# Patient Record
Sex: Male | Born: 1970 | Race: Black or African American | Hispanic: No | Marital: Married | Smoking: Never smoker
Health system: Southern US, Community
[De-identification: ages and names within clinical notes are randomized; demographics above are authoritative.]

## PROBLEM LIST (undated history)

## (undated) DIAGNOSIS — I10 Essential (primary) hypertension: Secondary | ICD-10-CM

## (undated) HISTORY — PX: BACK SURGERY: SHX140

---

## 1999-12-21 ENCOUNTER — Emergency Department (HOSPITAL_COMMUNITY): Admission: EM | Admit: 1999-12-21 | Discharge: 1999-12-21 | Payer: Self-pay | Admitting: Emergency Medicine

## 2000-01-01 ENCOUNTER — Encounter: Payer: Self-pay | Admitting: Occupational Medicine

## 2000-01-01 ENCOUNTER — Ambulatory Visit (HOSPITAL_COMMUNITY): Admission: RE | Admit: 2000-01-01 | Discharge: 2000-01-01 | Payer: Self-pay | Admitting: Occupational Medicine

## 2000-01-16 ENCOUNTER — Encounter: Payer: Self-pay | Admitting: Neurosurgery

## 2000-01-16 ENCOUNTER — Ambulatory Visit (HOSPITAL_COMMUNITY): Admission: RE | Admit: 2000-01-16 | Discharge: 2000-01-16 | Payer: Self-pay | Admitting: Neurosurgery

## 2000-02-17 ENCOUNTER — Encounter: Payer: Self-pay | Admitting: Neurosurgery

## 2000-02-17 ENCOUNTER — Inpatient Hospital Stay (HOSPITAL_COMMUNITY): Admission: RE | Admit: 2000-02-17 | Discharge: 2000-02-19 | Payer: Self-pay | Admitting: Neurosurgery

## 2000-02-21 ENCOUNTER — Encounter: Payer: Self-pay | Admitting: Neurosurgery

## 2000-02-21 ENCOUNTER — Ambulatory Visit (HOSPITAL_COMMUNITY): Admission: RE | Admit: 2000-02-21 | Discharge: 2000-02-21 | Payer: Self-pay | Admitting: Neurosurgery

## 2000-05-21 ENCOUNTER — Ambulatory Visit (HOSPITAL_COMMUNITY): Admission: RE | Admit: 2000-05-21 | Discharge: 2000-05-21 | Payer: Self-pay | Admitting: Neurosurgery

## 2000-05-21 ENCOUNTER — Encounter: Payer: Self-pay | Admitting: Neurosurgery

## 2001-04-02 ENCOUNTER — Emergency Department (HOSPITAL_COMMUNITY): Admission: EM | Admit: 2001-04-02 | Discharge: 2001-04-02 | Payer: Self-pay | Admitting: Emergency Medicine

## 2001-08-31 ENCOUNTER — Encounter: Payer: Self-pay | Admitting: Emergency Medicine

## 2001-08-31 ENCOUNTER — Emergency Department (HOSPITAL_COMMUNITY): Admission: EM | Admit: 2001-08-31 | Discharge: 2001-08-31 | Payer: Self-pay | Admitting: Emergency Medicine

## 2005-07-11 ENCOUNTER — Emergency Department (HOSPITAL_COMMUNITY): Admission: EM | Admit: 2005-07-11 | Discharge: 2005-07-11 | Payer: Self-pay | Admitting: Emergency Medicine

## 2008-01-03 ENCOUNTER — Emergency Department (HOSPITAL_COMMUNITY): Admission: EM | Admit: 2008-01-03 | Discharge: 2008-01-03 | Payer: Self-pay | Admitting: Emergency Medicine

## 2008-01-12 ENCOUNTER — Emergency Department (HOSPITAL_COMMUNITY): Admission: EM | Admit: 2008-01-12 | Discharge: 2008-01-12 | Payer: Self-pay | Admitting: Emergency Medicine

## 2011-01-24 NOTE — Op Note (Signed)
[place]. [facility]  Patient:    Troy Calderon, Troy Calderon                       MRN: [id number] Proc. Date: 02/17/00 Adm. Date:  [id number] Disc. 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  PROCEDURE:  Bilateral L5-S1 decompressive laminectomy followed by L5-S1 microdiskectomy for herniated disk followed by tangent interbody fusion.  SURGEON:  Reinaldo Meeker, M.D.  ASSISTANT:  Julio Sicks, M.D.  DESCRIPTION OF PROCEDURE:  After being placed in the prone position, the patients back was prepped and draped in the usual sterile fashion.  A midline incision was made above the lower lumbar spine and upper sacrum.  Using the Bovie cutting current, the incision was carried down to the spinous processes.  Subperiosteal  dissection was then carried out bilaterally in the spinous processes of the lamina as well as exposure of the facet joint.  Self-retaining retractor was placed for exposure.  Fluoroscopy showed approach of the L5-S1 level.  The spinous processes and intraspinous ligament was removed.  Highspeed drill was used to perform generous laminotomies by removing basically the entire L5 lamina which was free  floating due to the porous defects.  The residual bone was removed and saved for use later during the case.  Lateral dissection was carried out in order to identify both the L5 and S1 nerve roots bilaterally.  At this point, the microscope was draped and brought into the field.  Starting on the patients left, symptomatic side, microdissection technique was used to identify the lateral aspect of the thecal sac and S1 nerve root.  Further coagulation was carried down to the floor of the canal to identify the L5-S1 disk.  After  coagulating on the annulus, the annulus was incised with a 15 blade.  Using pituitary rongeurs and curets, thorough disk space cleanout was carried out.  Inspection of the foramen up towards the 5 nerve root yielded a large fragment of subligamentous disk and this was removed. This gave excellent decompression of the neuroforamen.  At this point, inspection was carried out for residual compression and none could be identified.  A similar diskectomy was then carried out on the patients right, asymptomatic side, once again being careful to fully decompress the L5 and S1 nerve roots.  At this point, large amounts of irrigation were carried out.  Using alternating distractors from one side to the other to distract the disk space, it was elected to place 12 x 0 mm tangent plugs.  Starting on the patients left side, tange instrumentation was used scraping, chiseling, and bone placement under fluoroscopic guidance.  The component was in good position.  The distractor was then removed from the patients right side and similar placement was carried out over there.  Fluroscopy at this time showed the bone plugs to be in good position.  Large amounts of irrigation  were carried out.  At this point, Julio Sicks, M.D. assumed control of the case or placement of pedicle screws.  Once those were completed, a medium hemovac drain was placed in the epidural space and the wound was then closed using  interrupted Vicryl on the muscle, fascia, subcutaneous and subcuticular tissues, and staples on the skin.  A sterile dressing was then applied.  The patient was extubated and taken to the recovery room in stable condition. DD:  02/17/00 TD:  02/19/00 Job: [id number]

## 2011-01-24 NOTE — Op Note (Signed)
[place]. [facility]  Patient:    Troy Calderon, Troy Calderon                       MRN: [id number] Proc. Date: 02/17/00 Adm. Date:  [id number] Disc. 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  OPERATION PERFORMED:  Posterolateral fusion with local autograft at L5-S1 augmented with pedicle screw instrumentation.  SURGEON:  Julio Sicks, M.D.  ASSISTANT:  ANESTHESIA:  General endotracheal.  INDICATIONS FOR PROCEDURE:  Mr. Troy Calderon is a 40 year old black male patient of Dr. Gerlene Fee.  The patient has history of severe back and left lower extremity pain consistent with a left-sided L5 radiculopathy which has failed efforts at conservative management.  MRI scanning and plain films demonstrate L5 spondylysis and early grade 1 L5-S1 spondylolisthesis as well as a leftward L5-S1 foraminal disk herniation.  The patient presents now for decompression by means of an L5 Gill procedure and left-sided L5-S1 microdiskectomy by Dr. Gerlene Fee.  He will also perform interbody fusion using tangent wedges and local autograft.  I have been asked to provide further support by means of a posterolateral fusion with pedicle screw instrumentation.  DESCRIPTION OF PROCEDURE:  After completion of the interbody fusion by Dr. Gerlene Fee, I exposed the transverse processes of L5 and the ala of the sacrum bilaterally.  The pedicles were then isolated, using surface landmarks and intraoperative fluoroscopy.  Cortical bone overlying the pedicles was removed using a high speed drill at all four locations.  A pedicle awl was then used to pass into the pedicled L5 starting first on the left side, then subsequently on the right side.  The pedicle awl was then passed into  the pedicle of S1 bilaterally as well.  All tracks were found to be solidly within bone by using blunt probes.  All tracks were then tapped using a 5.25 mm screw tap.  A screw tap holes were found to be solid within bone and well directed by fluoroscopy.  At L5 6.75 x 45 mm SDRS variable head and pedicle screws were placed bilaterally.  At S1 6.75 x 40 mm SDRS variable headed screws were placed bilaterally.  All screws were found to be well positioned by intraoperative fluoroscopy.  A short segment titanium rod was then placed between the screw heads.  It was contoured appropriately.  The screw heads were then engaged and locking caps were placed.  The construct was placed under compression.  The pedicle screws were found to be in good position on both lateral and AP fluoroscopy.  Blunt probes passed easily out the L5 and S1 foramina bilaterally.  There was no evidence of pedicle violation.  The transverse processes were then decorticated at L5 bilaterally as was the ala of the sacrum bilaterally.  Morselized autograft was packed posterolaterally for fusion.  The wound was then copiously irrigated with antibiotic solution. The ____________ inspected one final time.  There was no evidence of injury to the nerve roots or thecal sac.  Dr. Gerlene Fee then closed the wound in typical fashion leaving a medium hemovac drain in the epidural space. DD:  02/17/00 TD:  02/19/00 Job: [id number]

## 2013-09-15 ENCOUNTER — Encounter (HOSPITAL_COMMUNITY): Payer: Self-pay | Admitting: Emergency Medicine

## 2013-09-15 ENCOUNTER — Emergency Department (INDEPENDENT_AMBULATORY_CARE_PROVIDER_SITE_OTHER)
Admission: EM | Admit: 2013-09-15 | Discharge: 2013-09-15 | Disposition: A | Discharge status: Home / Self Care | Payer: Worker's Compensation | Source: Home / Self Care | Attending: Family Medicine | Admitting: Family Medicine

## 2013-09-15 DIAGNOSIS — M542 Cervicalgia: Secondary | ICD-10-CM | POA: Diagnosis not present

## 2013-09-15 DIAGNOSIS — M549 Dorsalgia, unspecified: Secondary | ICD-10-CM | POA: Diagnosis not present

## 2013-09-15 DIAGNOSIS — R51 Headache: Secondary | ICD-10-CM

## 2013-09-15 MED ORDER — CYCLOBENZAPRINE HCL 5 MG PO TABS: 5.0000 mg | ORAL_TABLET | Freq: Three times a day (TID) | ORAL | Status: DC | PRN

## 2013-09-15 MED ORDER — IBUPROFEN 800 MG PO TABS: 800.0000 mg | ORAL_TABLET | Freq: Three times a day (TID) | ORAL | Status: DC
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
## 2014-01-10 ENCOUNTER — Ambulatory Visit
Admission: RE | Admit: 2014-01-10 | Discharge: 2014-01-10 | Disposition: A | Discharge status: Home / Self Care | Payer: Worker's Compensation | Source: Ambulatory Visit | Attending: Family | Admitting: Family

## 2014-01-10 ENCOUNTER — Other Ambulatory Visit: Payer: Self-pay | Admitting: Family

## 2014-01-10 DIAGNOSIS — M2560 Stiffness of unspecified joint, not elsewhere classified: Principal | ICD-10-CM

## 2014-01-10 DIAGNOSIS — M2569 Stiffness of other specified joint, not elsewhere classified: Secondary | ICD-10-CM

## 2016-01-02 ENCOUNTER — Ambulatory Visit
Admission: RE | Admit: 2016-01-02 | Discharge: 2016-01-02 | Disposition: A | Discharge status: Home / Self Care | Payer: Worker's Compensation | Source: Ambulatory Visit | Attending: Nurse Practitioner | Admitting: Nurse Practitioner

## 2016-01-02 ENCOUNTER — Other Ambulatory Visit: Payer: Self-pay | Admitting: Nurse Practitioner

## 2016-01-02 DIAGNOSIS — M549 Dorsalgia, unspecified: Secondary | ICD-10-CM

## 2016-01-02 DIAGNOSIS — S39012A Strain of muscle, fascia and tendon of lower back, initial encounter: Secondary | ICD-10-CM

## 2016-08-03 ENCOUNTER — Emergency Department (HOSPITAL_COMMUNITY)
Admission: EM | Admit: 2016-08-03 | Discharge: 2016-08-03 | Disposition: A | Discharge status: Home / Self Care | Payer: No Typology Code available for payment source | Attending: Emergency Medicine | Admitting: Emergency Medicine

## 2016-08-03 ENCOUNTER — Encounter (HOSPITAL_COMMUNITY): Payer: Self-pay | Admitting: Emergency Medicine

## 2016-08-03 ENCOUNTER — Emergency Department (HOSPITAL_COMMUNITY): Payer: No Typology Code available for payment source

## 2016-08-03 DIAGNOSIS — Y92410 Unspecified street and highway as the place of occurrence of the external cause: Secondary | ICD-10-CM | POA: Insufficient documentation

## 2016-08-03 DIAGNOSIS — Y999 Unspecified external cause status: Secondary | ICD-10-CM | POA: Diagnosis not present

## 2016-08-03 DIAGNOSIS — I10 Essential (primary) hypertension: Secondary | ICD-10-CM | POA: Diagnosis not present

## 2016-08-03 DIAGNOSIS — S199XXA Unspecified injury of neck, initial encounter: Secondary | ICD-10-CM | POA: Diagnosis present

## 2016-08-03 DIAGNOSIS — S161XXA Strain of muscle, fascia and tendon at neck level, initial encounter: Secondary | ICD-10-CM | POA: Insufficient documentation

## 2016-08-03 DIAGNOSIS — Y939 Activity, unspecified: Secondary | ICD-10-CM | POA: Insufficient documentation

## 2016-08-03 HISTORY — DX: Essential (primary) hypertension: I10

## 2016-08-03 MED ORDER — CYCLOBENZAPRINE HCL 10 MG PO TABS
10.0000 mg | ORAL_TABLET | Freq: Once | ORAL | Status: AC
  Administered 2016-08-03: 10 mg via ORAL
  Filled 2016-08-03: qty 1

## 2016-08-03 MED ORDER — TRAMADOL HCL 50 MG PO TABS: 50.0000 mg | ORAL_TABLET | Freq: Four times a day (QID) | ORAL | 0 refills | Status: DC | PRN

## 2016-08-03 MED ORDER — METHOCARBAMOL 500 MG PO TABS: 500.0000 mg | ORAL_TABLET | Freq: Two times a day (BID) | ORAL | 0 refills | Status: DC

## 2016-08-03 MED ORDER — IBUPROFEN 400 MG PO TABS
800.0000 mg | ORAL_TABLET | Freq: Once | ORAL | Status: AC
  Administered 2016-08-03: 800 mg via ORAL
  Filled 2016-08-03: qty 2

## 2016-08-03 NOTE — Discharge Instructions (Signed)
The medication for pain and the muscle relaxant can make you sleepy. Do not drive while taking them.
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
## 2016-09-19 DIAGNOSIS — D500 Iron deficiency anemia secondary to blood loss (chronic): Secondary | ICD-10-CM | POA: Diagnosis not present

## 2016-10-02 DIAGNOSIS — I214 Non-ST elevation (NSTEMI) myocardial infarction: Secondary | ICD-10-CM | POA: Diagnosis not present

## 2016-10-10 DIAGNOSIS — I214 Non-ST elevation (NSTEMI) myocardial infarction: Secondary | ICD-10-CM | POA: Diagnosis not present

## 2016-11-05 DIAGNOSIS — I10 Essential (primary) hypertension: Secondary | ICD-10-CM | POA: Diagnosis not present

## 2016-11-07 DIAGNOSIS — I214 Non-ST elevation (NSTEMI) myocardial infarction: Secondary | ICD-10-CM | POA: Diagnosis not present

## 2016-12-15 DIAGNOSIS — I214 Non-ST elevation (NSTEMI) myocardial infarction: Secondary | ICD-10-CM | POA: Diagnosis not present

## 2017-01-09 DIAGNOSIS — I214 Non-ST elevation (NSTEMI) myocardial infarction: Secondary | ICD-10-CM | POA: Diagnosis not present

## 2017-01-27 DIAGNOSIS — Z0000 Encounter for general adult medical examination without abnormal findings: Secondary | ICD-10-CM | POA: Diagnosis not present

## 2017-01-27 DIAGNOSIS — I10 Essential (primary) hypertension: Secondary | ICD-10-CM | POA: Diagnosis not present

## 2017-01-27 DIAGNOSIS — Z125 Encounter for screening for malignant neoplasm of prostate: Secondary | ICD-10-CM | POA: Diagnosis not present

## 2017-01-27 DIAGNOSIS — Z13220 Encounter for screening for lipoid disorders: Secondary | ICD-10-CM | POA: Diagnosis not present

## 2017-03-17 DIAGNOSIS — R109 Unspecified abdominal pain: Secondary | ICD-10-CM | POA: Diagnosis not present

## 2017-03-24 ENCOUNTER — Other Ambulatory Visit: Payer: Self-pay | Admitting: Family Medicine

## 2017-03-24 DIAGNOSIS — K859 Acute pancreatitis without necrosis or infection, unspecified: Secondary | ICD-10-CM

## 2017-03-27 ENCOUNTER — Other Ambulatory Visit: Payer: Self-pay | Admitting: Family Medicine

## 2017-03-27 DIAGNOSIS — K859 Acute pancreatitis without necrosis or infection, unspecified: Secondary | ICD-10-CM

## 2017-04-01 ENCOUNTER — Ambulatory Visit
Admission: RE | Admit: 2017-04-01 | Discharge: 2017-04-01 | Disposition: A | Discharge status: Home / Self Care | Payer: 59 | Source: Ambulatory Visit | Attending: Family Medicine | Admitting: Family Medicine

## 2017-04-01 DIAGNOSIS — K859 Acute pancreatitis without necrosis or infection, unspecified: Secondary | ICD-10-CM | POA: Diagnosis not present

## 2017-04-01 MED ORDER — IOPAMIDOL (ISOVUE-300) INJECTION 61%: 100.0000 mL | Freq: Once | INTRAVENOUS | Status: DC | PRN

## 2017-08-03 DIAGNOSIS — R7303 Prediabetes: Secondary | ICD-10-CM | POA: Diagnosis not present

## 2017-08-03 DIAGNOSIS — R109 Unspecified abdominal pain: Secondary | ICD-10-CM | POA: Diagnosis not present

## 2018-01-27 DIAGNOSIS — I10 Essential (primary) hypertension: Secondary | ICD-10-CM | POA: Diagnosis not present

## 2018-01-27 DIAGNOSIS — Z6837 Body mass index (BMI) 37.0-37.9, adult: Secondary | ICD-10-CM | POA: Diagnosis not present

## 2018-01-27 DIAGNOSIS — Z0000 Encounter for general adult medical examination without abnormal findings: Secondary | ICD-10-CM | POA: Diagnosis not present

## 2018-03-24 DIAGNOSIS — I10 Essential (primary) hypertension: Secondary | ICD-10-CM | POA: Diagnosis not present

## 2018-06-24 DIAGNOSIS — J069 Acute upper respiratory infection, unspecified: Secondary | ICD-10-CM | POA: Diagnosis not present

## 2018-09-10 DIAGNOSIS — J988 Other specified respiratory disorders: Secondary | ICD-10-CM | POA: Diagnosis not present

## 2018-10-11 ENCOUNTER — Ambulatory Visit
Admission: RE | Admit: 2018-10-11 | Discharge: 2018-10-11 | Disposition: A | Discharge status: Home / Self Care | Payer: 59 | Source: Ambulatory Visit | Attending: Family Medicine | Admitting: Family Medicine

## 2018-10-11 ENCOUNTER — Other Ambulatory Visit: Payer: Self-pay | Admitting: Family Medicine

## 2018-10-11 DIAGNOSIS — R059 Cough, unspecified: Secondary | ICD-10-CM

## 2018-10-11 DIAGNOSIS — R05 Cough: Secondary | ICD-10-CM

## 2018-10-11 DIAGNOSIS — R509 Fever, unspecified: Secondary | ICD-10-CM | POA: Diagnosis not present

## 2018-12-24 DIAGNOSIS — I10 Essential (primary) hypertension: Secondary | ICD-10-CM | POA: Diagnosis not present

## 2019-05-20 ENCOUNTER — Encounter (HOSPITAL_COMMUNITY): Payer: Self-pay | Admitting: *Deleted

## 2019-05-20 ENCOUNTER — Emergency Department (HOSPITAL_COMMUNITY): Payer: 59

## 2019-05-20 ENCOUNTER — Other Ambulatory Visit: Payer: Self-pay

## 2019-05-20 ENCOUNTER — Inpatient Hospital Stay (HOSPITAL_COMMUNITY)
Admission: EM | Admit: 2019-05-20 | Discharge: 2019-05-25 | DRG: 177 | Disposition: A | Discharge status: Home / Self Care | Payer: 59 | Attending: Family Medicine | Admitting: Family Medicine

## 2019-05-20 DIAGNOSIS — E860 Dehydration: Secondary | ICD-10-CM | POA: Diagnosis present

## 2019-05-20 DIAGNOSIS — J1282 Pneumonia due to coronavirus disease 2019: Secondary | ICD-10-CM | POA: Diagnosis present

## 2019-05-20 DIAGNOSIS — E876 Hypokalemia: Secondary | ICD-10-CM | POA: Diagnosis not present

## 2019-05-20 DIAGNOSIS — U071 COVID-19: Principal | ICD-10-CM | POA: Diagnosis present

## 2019-05-20 DIAGNOSIS — R739 Hyperglycemia, unspecified: Secondary | ICD-10-CM | POA: Diagnosis present

## 2019-05-20 DIAGNOSIS — Z79891 Long term (current) use of opiate analgesic: Secondary | ICD-10-CM

## 2019-05-20 DIAGNOSIS — Z791 Long term (current) use of non-steroidal anti-inflammatories (NSAID): Secondary | ICD-10-CM

## 2019-05-20 DIAGNOSIS — I119 Hypertensive heart disease without heart failure: Secondary | ICD-10-CM | POA: Diagnosis present

## 2019-05-20 DIAGNOSIS — E871 Hypo-osmolality and hyponatremia: Secondary | ICD-10-CM | POA: Diagnosis present

## 2019-05-20 DIAGNOSIS — N179 Acute kidney failure, unspecified: Secondary | ICD-10-CM

## 2019-05-20 DIAGNOSIS — I959 Hypotension, unspecified: Secondary | ICD-10-CM | POA: Diagnosis present

## 2019-05-20 DIAGNOSIS — R55 Syncope and collapse: Secondary | ICD-10-CM

## 2019-05-20 DIAGNOSIS — J1289 Other viral pneumonia: Secondary | ICD-10-CM | POA: Diagnosis present

## 2019-05-20 DIAGNOSIS — Z79899 Other long term (current) drug therapy: Secondary | ICD-10-CM

## 2019-05-20 DIAGNOSIS — E861 Hypovolemia: Secondary | ICD-10-CM | POA: Diagnosis present

## 2019-05-20 LAB — CBC
HCT: 42.4 % (ref 39.0–52.0)
Hemoglobin: 14.3 g/dL (ref 13.0–17.0)
MCH: 29.3 pg (ref 26.0–34.0)
MCHC: 33.7 g/dL (ref 30.0–36.0)
MCV: 86.9 fL (ref 80.0–100.0)
Platelets: 167 10*3/uL (ref 150–400)
RBC: 4.88 MIL/uL (ref 4.22–5.81)
RDW: 13.4 % (ref 11.5–15.5)
WBC: 5.4 10*3/uL (ref 4.0–10.5)
nRBC: 0 % (ref 0.0–0.2)

## 2019-05-20 LAB — BASIC METABOLIC PANEL
Anion gap: 12 (ref 5–15)
BUN: 13 mg/dL (ref 6–20)
CO2: 24 mmol/L (ref 22–32)
Calcium: 8.7 mg/dL — ABNORMAL LOW (ref 8.9–10.3)
Chloride: 96 mmol/L — ABNORMAL LOW (ref 98–111)
Creatinine, Ser: 1.96 mg/dL — ABNORMAL HIGH (ref 0.61–1.24)
GFR calc Af Amer: 46 mL/min — ABNORMAL LOW (ref >60)
GFR calc non Af Amer: 39 mL/min — ABNORMAL LOW (ref >60)
Glucose, Bld: 130 mg/dL — ABNORMAL HIGH (ref 70–99)
Potassium: 3 mmol/L — ABNORMAL LOW (ref 3.5–5.1)
Sodium: 132 mmol/L — ABNORMAL LOW (ref 135–145)

## 2019-05-20 LAB — TROPONIN I (HIGH SENSITIVITY): Troponin I (High Sensitivity): 6 ng/L (ref <18)

## 2019-05-20 MED ORDER — LACTATED RINGERS IV BOLUS
500.0000 mL | Freq: Once | INTRAVENOUS | Status: AC
  Administered 2019-05-21: 500 mL via INTRAVENOUS

## 2019-05-20 MED ORDER — ACETAMINOPHEN 500 MG PO TABS
1000.0000 mg | ORAL_TABLET | Freq: Once | ORAL | Status: AC
  Administered 2019-05-20: 1000 mg via ORAL
  Filled 2019-05-20: qty 2

## 2019-05-20 MED ORDER — SODIUM CHLORIDE 0.9% FLUSH
3.0000 mL | Freq: Once | INTRAVENOUS | Status: AC
  Administered 2019-05-21: 3 mL via INTRAVENOUS

## 2019-05-20 MED ORDER — POTASSIUM CHLORIDE CRYS ER 20 MEQ PO TBCR
40.0000 meq | EXTENDED_RELEASE_TABLET | Freq: Once | ORAL | Status: AC
  Administered 2019-05-21: 40 meq via ORAL
  Filled 2019-05-20: qty 2

## 2019-05-20 MED ORDER — IOHEXOL 350 MG/ML SOLN
100.0000 mL | Freq: Once | INTRAVENOUS | Status: AC | PRN
  Administered 2019-05-20: 100 mL via INTRAVENOUS

## 2019-05-20 MED ORDER — ASPIRIN 81 MG PO CHEW
324.0000 mg | CHEWABLE_TABLET | Freq: Once | ORAL | Status: AC
  Administered 2019-05-20: 324 mg via ORAL
  Filled 2019-05-20: qty 4

## 2019-05-20 NOTE — ED Provider Notes (Signed)
[facility] Provider Note   CSN: [id number] Arrival date & time: 05/20/19  1956     History   Chief Complaint Chief Complaint  Patient presents with  . Chest Pain    HPI Troy Calderon is a [age] y.o. male with a past medical history of hypertension who recently tested positive for COVID who presented to the emergency department with shortness of breath.  Patient had a syncopal episode while in triage.  Patient reports prior to having a syncopal episode he felt increasingly short of breath.  Patient reports he tested positive for COVID-19 5 days ago and that his family also tested positive.  Patient reports over the last 1 to 2 days he has felt worsening shortness of breath and having intermittent left chest pressure which he has not noticed change with exertion.  Patient denies nausea or diaphoresis.  Patient reports he has had difficulty sleeping the last 2 nights due to shortness of breath.    The history is provided by the patient.    Past Medical History:  Diagnosis Date  . Hypertension     Patient Active Problem List   Diagnosis Date Noted  . Acute kidney injury (HCC) 05/21/2019    Past Surgical History:  Procedure Laterality Date  . BACK SURGERY          Home Medications    Prior to Admission medications   Medication Sig Start Date End Date Taking? Authorizing Provider  cyclobenzaprine (FLEXERIL) 5 MG tablet Take 1 tablet (5 mg total) by mouth 3 (three) times daily as needed for muscle spasms. 09/15/13   Linna HoffKindl, James D, MD  ibuprofen (ADVIL,MOTRIN) 800 MG tablet Take 1 tablet (800 mg total) by mouth 3 (three) times daily. For pain 09/15/13   Linna HoffKindl, James D, MD  methocarbamol (ROBAXIN) 500 MG tablet Take 1 tablet (500 mg total) by mouth 2 (two) times daily. 08/03/16   Janne NapoleonNeese, Hope M, NP  traMADol (ULTRAM) 50 MG tablet Take 1 tablet (50 mg total) by mouth every 6 (six) hours as needed. 08/03/16   Janne NapoleonNeese, Hope M, NP    Family History  No family history on file.  Social History Social History   Tobacco Use  . Smoking status: Never Smoker  . Smokeless tobacco: Never Used  Substance Use Topics  . Alcohol use: Yes  . Drug use: No     Allergies   Patient has no known allergies.   Review of Systems Review of Systems  Constitutional: Positive for fatigue and fever.  HENT: Negative for trouble swallowing.   Respiratory: Positive for cough, chest tightness and shortness of breath.   Cardiovascular: Negative for leg swelling.  Gastrointestinal: Negative for abdominal pain, constipation and vomiting.  Genitourinary: Negative for difficulty urinating and dysuria.  Musculoskeletal: Negative for back pain.  Skin: Negative for rash.  Neurological: Positive for syncope. Negative for numbness and headaches.  Psychiatric/Behavioral: Negative for confusion.     Physical Exam Updated Vital Signs BP 122/85   Pulse 98   Temp 98.6 F (37 C) (Oral)   Resp (!) 22   Ht 6\' 1"  (1.854 m)   Wt 121.6 kg   SpO2 96%   BMI 35.36 kg/m   Physical Exam Constitutional:      General: He is not in acute distress. HENT:     Head: Normocephalic and atraumatic.     Right Ear: External ear normal.     Left Ear: External ear normal.  Nose: Nose normal.     Mouth/Throat:     Mouth: Mucous membranes are moist.     Pharynx: Oropharynx is clear.  Eyes:     Conjunctiva/sclera: Conjunctivae normal.  Neck:     Musculoskeletal: Neck supple.  Cardiovascular:     Rate and Rhythm: Regular rhythm. Tachycardia present.     Pulses: Normal pulses.  Pulmonary:     Effort: Pulmonary effort is normal. No respiratory distress.     Breath sounds: No stridor. No wheezing.     Comments: Tachypneic Chest:     Chest wall: No tenderness.  Abdominal:     Palpations: Abdomen is soft.     Tenderness: There is no abdominal tenderness. There is no guarding or rebound.  Musculoskeletal:        General: No tenderness.     Right lower leg: No  edema.     Left lower leg: No edema.  Skin:    General: Skin is warm and dry.  Neurological:     General: No focal deficit present.     Mental Status: He is alert and oriented to person, place, and time.     Cranial Nerves: No cranial nerve deficit.     Sensory: No sensory deficit.     Motor: No weakness.      ED Treatments / Results  Labs (all labs ordered are listed, but only abnormal results are displayed) Labs Reviewed  BASIC METABOLIC PANEL - Abnormal; Notable for the following components:      Result Value   Sodium 132 (*)    Potassium 3.0 (*)    Chloride 96 (*)    Glucose, Bld 130 (*)    Creatinine, Ser 1.96 (*)    Calcium 8.7 (*)    GFR calc non Af Amer 39 (*)    GFR calc Af Amer 46 (*)    All other components within normal limits  FIBRINOGEN - Abnormal; Notable for the following components:   Fibrinogen 516 (*)    All other components within normal limits  SARS CORONAVIRUS 2 (HOSPITAL ORDER, PERFORMED IN [facility])  CULTURE, BLOOD (ROUTINE X 2)  CULTURE, BLOOD (ROUTINE X 2)  CBC  D-DIMER, QUANTITATIVE (NOT AT [facility])  MAGNESIUM  LACTIC ACID, PLASMA  LACTIC ACID, PLASMA  PROCALCITONIN  LACTATE DEHYDROGENASE  FERRITIN  TRIGLYCERIDES  C-REACTIVE PROTEIN  HEPATIC FUNCTION PANEL  HIV ANTIBODY (ROUTINE TESTING W REFLEX)  CBC  CREATININE, SERUM  ABO/RH  TROPONIN I (HIGH SENSITIVITY)  TROPONIN I (HIGH SENSITIVITY)    EKG EKG Interpretation  Date/Time:  Friday May 20 2019 21:03:42 EDT Ventricular Rate:  99 PR Interval:  136 QRS Duration: 93 QT Interval:  341 QTC Calculation: 438 R Axis:   70 Text Interpretation:  Sinus rhythm Repol abnrm suggests ischemia, diffuse leads Minimal ST elevation, lateral leads similar to earlier day Confirmed by Marily Memos [phone]) on 05/21/2019 12:02:42 AM   Radiology Ct Angio Chest Pe W And/or Wo Contrast  Result Date: 05/21/2019 CLINICAL DATA:  48 year old male with concern for pulmonary  embolism. COVID-19. EXAM: CT ANGIOGRAPHY CHEST WITH CONTRAST TECHNIQUE: Multidetector CT imaging of the chest was performed using the standard protocol during bolus administration of intravenous contrast. Multiplanar CT image reconstructions and MIPs were obtained to evaluate the vascular anatomy. CONTRAST:  OMNIPAQUE IOHEXOL 350 MG/ML SOLN COMPARISON:  Chest radiograph dated 05/20/2019 FINDINGS: Cardiovascular: There is no cardiomegaly or pericardial effusion. The thoracic aorta is unremarkable. Evaluation of the pulmonary arteries is  somewhat limited due to suboptimal opacification and respiratory motion artifact. No large or central pulmonary artery embolus identified. Mediastinum/Nodes: No hilar or mediastinal adenopathy. The esophagus and the thyroid gland are grossly unremarkable. No mediastinal fluid collection. Lungs/Pleura: Confluent bilateral predominantly peripheral and subpleural airspace opacities most consistent with multifocal pneumonia. There are a spectrum of findings in the lungs which can be seen with acute atypical infection (as well as other non-infectious etiologies). In particular, viral pneumonia (including COVID-19) should be considered in the appropriate clinical setting. There is no pleural effusion pneumothorax. The central airways are patent. Upper Abdomen: No acute abnormality. Musculoskeletal: No chest wall abnormality. No acute or significant osseous findings. Review of the MIP images confirms the above findings. IMPRESSION: 1. No CT evidence of central pulmonary artery embolus. 2. Bilateral predominantly peripheral and subpleural airspace opacities most consistent with multifocal pneumonia. Clinical correlation and follow-up to resolution recommended. Electronically Signed   By: Anner Crete M.D.   On: 05/21/2019 00:16   Dg Chest Portable 1 View  Result Date: 05/20/2019 CLINICAL DATA:  Acute shortness of breath and chest pain. COVID-19 positive. EXAM: PORTABLE CHEST 1  VIEW COMPARISON:  10/11/2018 chest radiograph FINDINGS: The cardiomediastinal silhouette is unremarkable. Bilateral LOWER lung airspace opacities are noted, LEFT-greater-than-RIGHT. No definite pleural effusion. No pneumothorax or acute bony abnormality. IMPRESSION: Bilateral LOWER lung airspace opacities, LEFT-greater-than-RIGHT, likely representing pneumonia/infection. Electronically Signed   By: Margarette Canada M.D.   On: 05/20/2019 21:09    Procedures Procedures (including critical care time)  Medications Ordered in ED Medications  sodium chloride flush (NS) 0.9 % injection 3 mL (has no administration in time range)  enoxaparin (LOVENOX) injection 40 mg (has no administration in time range)  dexamethasone (DECADRON) injection 6 mg (has no administration in time range)  acetaminophen (TYLENOL) tablet 1,000 mg (1,000 mg Oral Given 05/20/19 2122)  aspirin chewable tablet 324 mg (324 mg Oral Given 05/20/19 2123)  potassium chloride SA (K-DUR) CR tablet 40 mEq (40 mEq Oral Given 05/21/19 0018)  lactated ringers bolus 500 mL (500 mLs Intravenous New Bag/Given 05/21/19 0018)  iohexol (OMNIPAQUE) 350 MG/ML injection 100 mL (100 mLs Intravenous Contrast Given 05/20/19 2356)     Initial Impression / Assessment and Plan / ED Course  I have reviewed the triage vital signs and the nursing notes.  Pertinent labs & imaging results that were available during my care of the patient were reviewed by me and considered in my medical decision making (see chart for details).       Concern for worsening COVID-19 infection.  Concern for possible ACS or PE given patient's syncopal episode and EKG with inferior and lateral T wave inversions with no prior EKG to compare.  Patient not currently experiencing chest pressure, ASA 324 mg given.  Patient mildly tachypneic without increased work of breathing with an SPO2 in the mid to high 90s on room air.  Laboratory studies reveal hypokalemia and likely AKI, no prior  laboratory studies to compare however.  Patient denies history of known renal disease.  Patient given 500 cc bolus and potassium was repleted.  CTA chest without evidence of PE.  Given patient's high risk syncope, EKG changes, AKI, and electrolyte abnormalities the hospitalist service was consulted for admission.  Hospitalists to admit patient for further evaluation and management.  Patient seen and plan discussed with Dr. Laverta Baltimore.  Final Clinical Impressions(s) / ED Diagnoses   Final diagnoses:  COVID-19  Syncope, unspecified syncope type  Hypokalemia  AKI (acute kidney injury) (  [facility])    ED Discharge Orders    None       Joselyn ArrowKuefler, Philmore PaliKatherine S, MD 05/21/19 Angelina [id number]    Long, Joshua G, MD 05/21/19 [phone]

## 2019-05-20 NOTE — ED Notes (Signed)
Spoke with CT about bringing pt over for scan. Stated they need to get other pt out of scanner first and then would call this RN back when ready for patient

## 2019-05-20 NOTE — ED Triage Notes (Signed)
The pt came in for some sob and chest pain all day.  He was tested positive for covid on Monday   [place]  His wife and daughter also has the covid

## 2019-05-20 NOTE — ED Notes (Signed)
The pt fainted for a few seconds while being triaged he came right back around almost immediately.   He reports that he felt like he could not breathe

## 2019-05-21 ENCOUNTER — Encounter (HOSPITAL_COMMUNITY): Payer: Self-pay

## 2019-05-21 DIAGNOSIS — J1289 Other viral pneumonia: Secondary | ICD-10-CM | POA: Diagnosis not present

## 2019-05-21 DIAGNOSIS — E861 Hypovolemia: Secondary | ICD-10-CM | POA: Diagnosis not present

## 2019-05-21 DIAGNOSIS — N179 Acute kidney failure, unspecified: Secondary | ICD-10-CM

## 2019-05-21 DIAGNOSIS — E860 Dehydration: Secondary | ICD-10-CM | POA: Diagnosis not present

## 2019-05-21 DIAGNOSIS — Z79899 Other long term (current) drug therapy: Secondary | ICD-10-CM | POA: Diagnosis not present

## 2019-05-21 DIAGNOSIS — I9589 Other hypotension: Secondary | ICD-10-CM

## 2019-05-21 DIAGNOSIS — I959 Hypotension, unspecified: Secondary | ICD-10-CM | POA: Diagnosis not present

## 2019-05-21 DIAGNOSIS — R739 Hyperglycemia, unspecified: Secondary | ICD-10-CM | POA: Diagnosis not present

## 2019-05-21 DIAGNOSIS — E871 Hypo-osmolality and hyponatremia: Secondary | ICD-10-CM | POA: Diagnosis not present

## 2019-05-21 DIAGNOSIS — E876 Hypokalemia: Secondary | ICD-10-CM | POA: Diagnosis present

## 2019-05-21 DIAGNOSIS — Z791 Long term (current) use of non-steroidal anti-inflammatories (NSAID): Secondary | ICD-10-CM | POA: Diagnosis not present

## 2019-05-21 DIAGNOSIS — I119 Hypertensive heart disease without heart failure: Secondary | ICD-10-CM | POA: Diagnosis not present

## 2019-05-21 DIAGNOSIS — U071 COVID-19: Secondary | ICD-10-CM | POA: Diagnosis not present

## 2019-05-21 DIAGNOSIS — R55 Syncope and collapse: Secondary | ICD-10-CM | POA: Diagnosis not present

## 2019-05-21 DIAGNOSIS — Z79891 Long term (current) use of opiate analgesic: Secondary | ICD-10-CM | POA: Diagnosis not present

## 2019-05-21 DIAGNOSIS — J988 Other specified respiratory disorders: Secondary | ICD-10-CM | POA: Diagnosis not present

## 2019-05-21 LAB — CBC
HCT: 41.4 % (ref 39.0–52.0)
Hemoglobin: 13.9 g/dL (ref 13.0–17.0)
MCH: 29.5 pg (ref 26.0–34.0)
MCHC: 33.6 g/dL (ref 30.0–36.0)
MCV: 87.9 fL (ref 80.0–100.0)
Platelets: 152 10*3/uL (ref 150–400)
RBC: 4.71 MIL/uL (ref 4.22–5.81)
RDW: 13.5 % (ref 11.5–15.5)
WBC: 4 10*3/uL (ref 4.0–10.5)
nRBC: 0 % (ref 0.0–0.2)

## 2019-05-21 LAB — C-REACTIVE PROTEIN: CRP: 5.5 mg/dL — ABNORMAL HIGH (ref <1.0)

## 2019-05-21 LAB — HEPATIC FUNCTION PANEL
ALT: 19 U/L (ref 0–44)
AST: 29 U/L (ref 15–41)
Albumin: 3.3 g/dL — ABNORMAL LOW (ref 3.5–5.0)
Alkaline Phosphatase: 43 U/L (ref 38–126)
Bilirubin, Direct: 0.2 mg/dL (ref 0.0–0.2)
Indirect Bilirubin: 0.7 mg/dL (ref 0.3–0.9)
Total Bilirubin: 0.9 mg/dL (ref 0.3–1.2)
Total Protein: 7.2 g/dL (ref 6.5–8.1)

## 2019-05-21 LAB — ABO/RH: ABO/RH(D): B POS

## 2019-05-21 LAB — TRIGLYCERIDES: Triglycerides: 55 mg/dL (ref <150)

## 2019-05-21 LAB — SARS CORONAVIRUS 2 BY RT PCR (HOSPITAL ORDER, PERFORMED IN ~~LOC~~ HOSPITAL LAB): SARS Coronavirus 2: POSITIVE — AB

## 2019-05-21 LAB — D-DIMER, QUANTITATIVE: D-Dimer, Quant: 0.44 ug/mL-FEU (ref 0.00–0.50)

## 2019-05-21 LAB — FIBRINOGEN: Fibrinogen: 516 mg/dL — ABNORMAL HIGH (ref 210–475)

## 2019-05-21 LAB — CREATININE, SERUM
Creatinine, Ser: 1.51 mg/dL — ABNORMAL HIGH (ref 0.61–1.24)
GFR calc Af Amer: >60 mL/min (ref >60)
GFR calc non Af Amer: 54 mL/min — ABNORMAL LOW (ref >60)

## 2019-05-21 LAB — HIV ANTIBODY (ROUTINE TESTING W REFLEX): HIV Screen 4th Generation wRfx: NONREACTIVE

## 2019-05-21 LAB — TROPONIN I (HIGH SENSITIVITY): Troponin I (High Sensitivity): 4 ng/L (ref <18)

## 2019-05-21 LAB — PROCALCITONIN: Procalcitonin: <0.1 ng/mL

## 2019-05-21 LAB — MAGNESIUM: Magnesium: 1.9 mg/dL (ref 1.7–2.4)

## 2019-05-21 LAB — LACTATE DEHYDROGENASE: LDH: 255 U/L — ABNORMAL HIGH (ref 98–192)

## 2019-05-21 LAB — FERRITIN: Ferritin: 345 ng/mL — ABNORMAL HIGH (ref 24–336)

## 2019-05-21 MED ORDER — TRAMADOL HCL 50 MG PO TABS: 50.0000 mg | ORAL_TABLET | Freq: Four times a day (QID) | ORAL | Status: DC | PRN

## 2019-05-21 MED ORDER — HYDROCOD POLST-CPM POLST ER 10-8 MG/5ML PO SUER
5.0000 mL | Freq: Two times a day (BID) | ORAL | Status: DC | PRN
  Administered 2019-05-21: 5 mL via ORAL
  Filled 2019-05-21: qty 5

## 2019-05-21 MED ORDER — ENOXAPARIN SODIUM 40 MG/0.4ML ~~LOC~~ SOLN: 40.0000 mg | SUBCUTANEOUS | Status: DC

## 2019-05-21 MED ORDER — SODIUM CHLORIDE 0.9 % IV SOLN
200.0000 mg | Freq: Once | INTRAVENOUS | Status: AC
  Administered 2019-05-21: 200 mg via INTRAVENOUS
  Filled 2019-05-21: qty 40

## 2019-05-21 MED ORDER — SENNOSIDES-DOCUSATE SODIUM 8.6-50 MG PO TABS: 1.0000 | ORAL_TABLET | Freq: Two times a day (BID) | ORAL | Status: DC | PRN

## 2019-05-21 MED ORDER — ENOXAPARIN SODIUM 40 MG/0.4ML ~~LOC~~ SOLN: 40.0000 mg | Freq: Every day | SUBCUTANEOUS | Status: DC

## 2019-05-21 MED ORDER — SODIUM CHLORIDE 0.9 % IV SOLN
INTRAVENOUS | Status: DC
  Administered 2019-05-21 (×2): via INTRAVENOUS

## 2019-05-21 MED ORDER — ACETAMINOPHEN 325 MG PO TABS
650.0000 mg | ORAL_TABLET | Freq: Four times a day (QID) | ORAL | Status: DC | PRN
  Administered 2019-05-21: 650 mg via ORAL
  Filled 2019-05-21: qty 2

## 2019-05-21 MED ORDER — DEXAMETHASONE SODIUM PHOSPHATE 10 MG/ML IJ SOLN
6.0000 mg | Freq: Every day | INTRAMUSCULAR | Status: DC
  Administered 2019-05-21 – 2019-05-25 (×5): 6 mg via INTRAVENOUS
  Filled 2019-05-21 (×5): qty 1

## 2019-05-21 MED ORDER — CYCLOBENZAPRINE HCL 5 MG PO TABS
5.0000 mg | ORAL_TABLET | Freq: Three times a day (TID) | ORAL | Status: DC | PRN
  Filled 2019-05-21: qty 1

## 2019-05-21 MED ORDER — AMLODIPINE BESYLATE 5 MG PO TABS
10.0000 mg | ORAL_TABLET | Freq: Every day | ORAL | Status: DC
  Administered 2019-05-21 – 2019-05-25 (×5): 10 mg via ORAL
  Filled 2019-05-21 (×5): qty 2

## 2019-05-21 MED ORDER — ENOXAPARIN SODIUM 60 MG/0.6ML ~~LOC~~ SOLN
60.0000 mg | Freq: Every day | SUBCUTANEOUS | Status: DC
  Administered 2019-05-21 – 2019-05-25 (×5): 60 mg via SUBCUTANEOUS
  Filled 2019-05-21 (×6): qty 0.6

## 2019-05-21 MED ORDER — ONDANSETRON HCL 4 MG/2ML IJ SOLN: 4.0000 mg | Freq: Four times a day (QID) | INTRAMUSCULAR | Status: DC | PRN

## 2019-05-21 MED ORDER — SODIUM CHLORIDE 0.9 % IV SOLN
100.0000 mg | INTRAVENOUS | Status: AC
  Administered 2019-05-22 – 2019-05-25 (×4): 100 mg via INTRAVENOUS
  Filled 2019-05-21 (×4): qty 20

## 2019-05-21 MED ORDER — GUAIFENESIN-DM 100-10 MG/5ML PO SYRP
5.0000 mL | ORAL_SOLUTION | ORAL | Status: DC | PRN
  Administered 2019-05-21 – 2019-05-24 (×6): 5 mL via ORAL
  Filled 2019-05-21 (×6): qty 10

## 2019-05-21 MED ORDER — ZOLPIDEM TARTRATE 5 MG PO TABS
5.0000 mg | ORAL_TABLET | Freq: Every evening | ORAL | Status: DC | PRN
  Administered 2019-05-21 – 2019-05-22 (×2): 5 mg via ORAL
  Filled 2019-05-21: qty 1
  Filled 2019-05-21: qty 2

## 2019-05-21 NOTE — ED Notes (Signed)
[facility] for transport to [facility]  

## 2019-05-21 NOTE — TOC Initial Note (Signed)
Transition of Care [facility]) - Initial/Assessment Note    Patient Details  Name: TORRIS HOUSE MRN: [id number] Date of Birth: 07-Oct-1970  Transition of Care [facility]) CM/SW Contact:    Ninfa Meeker, RN Phone Number: [phone] (working remotely) 05/21/2019, 12:48 PM  Clinical Narrative:     48 yr old gentleman admitted with COVID 19 symptoms. On RA, started on Remdesivir. From home with wife. Case manager will continue to monitor for needs as he medically improves. May God bless him to do so.                     Patient Goals and CMS Choice        Expected Discharge Plan and Services                                                Prior Living Arrangements/Services                       Activities of Daily Living Home Assistive Devices/Equipment: None ADL Screening (condition at time of admission) Patient's cognitive ability adequate to safely complete daily activities?: Yes Is the patient deaf or have difficulty hearing?: No Does the patient have difficulty seeing, even when wearing glasses/contacts?: No Does the patient have difficulty concentrating, remembering, or making decisions?: No Patient able to express need for assistance with ADLs?: Yes Does the patient have difficulty dressing or bathing?: No Independently performs ADLs?: Yes (appropriate for developmental age) Does the patient have difficulty walking or climbing stairs?: No Weakness of Legs: None Weakness of Arms/Hands: None  Permission Sought/Granted                  Emotional Assessment              Admission diagnosis:  Hypokalemia [E87.6] AKI (acute kidney injury) ([place]) [N17.9] Syncope, unspecified syncope type [R55] COVID-19 [U07.1, J98.8] Patient Active Problem List   Diagnosis Date Noted  . Acute kidney injury ([place]) 05/21/2019  . COVID-19   . Hypotension due to hypovolemia    PCP:  Patient, No Pcp Per Pharmacy:   [id number] - [place], [place] [address] [place] [address] Phone: [phone] Fax: [phone]     Social Determinants of Health (SDOH) Interventions    Readmission Risk Interventions No flowsheet data found.

## 2019-05-21 NOTE — Progress Notes (Signed)
Remdesivir - Pharmacy Brief Note   O:  ALT: 19 9/11 CXR:  Bilateral LOWER lung airspace opacities, LEFT-greater-than-RIGHT, likely representing pneumonia/infection 9/11 CTa: no PE, Bilateral predominantly peripheral and subpleural airspace opacities most consistent with multifocal pneumonia SpO2: 92-98% on room air   A/P:  Remdesivir 200 mg once followed by 100 mg daily x 4 days.   Gretta Arab PharmD, BCPS Clinical pharmacist phone 7am- 5pm: [phone] 05/21/2019 11:19 AM

## 2019-05-21 NOTE — ED Notes (Signed)
[facility] here to transport patient. 

## 2019-05-21 NOTE — ED Notes (Signed)
Report given to RN at [facility]. All questions answered

## 2019-05-21 NOTE — Progress Notes (Addendum)
[place] TEAM 1 - Stepdown/ICU TEAM  Troy Calderon  HID:[id number] DOB: March 02, 1971 DOA: 05/20/2019 PCP: Patient, No Pcp Per    Brief Narrative:  48yo with a history of HTN who presented with chest pain and increasing shortness of breath.  He was diagnosed with COVID 2 to 3 days prior to his presentation at an outpatient clinic.  He reported progressive worsening of midsternal chest pressure with intermittent shortness of breath that occurs mostly at rest.  He suffered a syncopal spell in triage and was found to have a systolic blood pressure of 90 with a heart rate of 110.  He was also noted to have acute kidney injury.  CTa chest revealed no pulmonary embolism but multifocal pulmonary infiltrates.  Significant Events: 9/12 admit to [facility] via [facility] ED  COVID-19 specific Treatment: Decadron 9/12 > Remdesivir 9/12 >  Subjective: Resting comfortably in bed.  Reports that he feels a little less short of breath today.  Denies current chest pressure nausea vomiting or abdominal pain.  Assessment & Plan:  COVID pneumonia Continue Decadron -I spoke with the patient about Remdesivir and the fact that it is available via emergency use authorization by the FDA -I discussed with him the potential side effects as well as the potential benefits of his use -he voiced understanding and has given his consent to using Remdesivir -this will be initiated today -monitor closely -though his CT chest is worrisome clinically he is relatively stable at this time  Recent Labs  Lab 05/20/19 2313  DDIMER 0.44  FERRITIN 345*  CRP 5.5*  ALT 19  PROCALCITON <0.10    Acute kidney injury Baseline creatinine 1.06 in June 2020 -creatinine improving with gentle hydration -likely simple prerenal azotemia  Recent Labs  Lab 05/20/19 2019 05/21/19 0119  CREATININE 1.96* 1.51*    Hypotension - dehydration Resolved with gentle volume resuscitation  HTN Blood pressure now becoming elevated status post  volume resuscitation -continue to hold ARB and diuretic in setting of acute kidney injury -utilize Norvasc short-term  Hypokalemia Recheck in AM -likely consequence of poor oral intake  Hyponatremia Due to hypovolemia -should correct with volume resuscitation -follow-up in a.m.  Hyperglycemia Serum glucose 130 at time of presentation -check A1c in a.m.  DVT prophylaxis: Lovenox Code Status: FULL CODE Family Communication:  Disposition Plan: Telemetry bed  Consultants:  none  Antimicrobials:  none  Objective: Blood pressure 124/82, pulse 94, temperature 99.1 F (37.3 C), temperature source Oral, resp. rate 20, height 6\' 1"  (1.854 m), weight 121.6 kg, SpO2 96 %.  Intake/Output Summary (Last 24 hours) at 05/21/2019 0903 Last data filed at 05/21/2019 0500 Gross per 24 hour  Intake 371.43 ml  Output -  Net 371.43 ml   Filed Weights   05/20/19 2014  Weight: 121.6 kg    Examination: General: No acute respiratory distress Lungs: Fine crackles bilateral bases with no wheezing Cardiovascular: Regular rate and rhythm without murmur gallop or rub normal S1 and S2 Abdomen: Nontender, nondistended, soft, bowel sounds positive, no rebound, no ascites, no appreciable mass Extremities: No significant cyanosis, clubbing, or edema bilateral lower extremities  CBC: Recent Labs  Lab 05/20/19 2019 05/21/19 0119  WBC 5.4 4.0  HGB 14.3 13.9  HCT 42.4 41.4  MCV 86.9 87.9  PLT 167 152   Basic Metabolic Panel: Recent Labs  Lab 05/20/19 2019 05/21/19 0045 05/21/19 0119  NA 132*  --   --   K 3.0*  --   --   CL  96*  --   --   CO2 24  --   --   GLUCOSE 130*  --   --   BUN 13  --   --   CREATININE 1.96*  --  1.51*  CALCIUM 8.7*  --   --   MG  --  1.9  --    GFR: Estimated Creatinine Clearance: 81.7 mL/min (A) (by C-G formula based on SCr of 1.51 mg/dL (H)).  Liver Function Tests: Recent Labs  Lab 05/20/19 2313  AST 29  ALT 19  ALKPHOS 43  BILITOT 0.9  PROT 7.2   ALBUMIN 3.3*     Recent Results (from the past 240 hour(s))  SARS Coronavirus 2 [facility](Hospital order, Performed [facility]) Nasopharyngeal Nasopharyngeal Swab     Status: Abnormal   Collection Time: 05/21/19 12:19 AM   Specimen: Nasopharyngeal Swab  Result Value Ref Range Status   SARS Coronavirus 2 POSITIVE (A) NEGATIVE Final    Comment: RESULT CALLED TO, READ BACK BY AND VERIFIED WITH: Santiago BumpersWILLIAM A, RN AT [phone] ON 05/21/2019 BY SAINVILUS S (NOTE) If result is NEGATIVE SARS-CoV-2 target nucleic acids are NOT DETECTED. The SARS-CoV-2 RNA is generally detectable in upper and lower  respiratory specimens during the acute phase of infection. The lowest  concentration of SARS-CoV-2 viral copies this assay can detect is 250  copies / mL. A negative result does not preclude SARS-CoV-2 infection  and should not be used as the sole basis for treatment or other  patient management decisions.  A negative result may occur with  improper specimen collection / handling, submission of specimen other  than nasopharyngeal swab, presence of viral mutation(s) within the  areas targeted by this assay, and inadequate number of viral copies  (<250 copies / mL). A negative result must be combined with clinical  observations, patient history, and epidemiological information. If result is POSITIVE SARS-CoV-2 target nucleic acids ar e DETECTED. The SARS-CoV-2 RNA is generally detectable in upper and lower  respiratory specimens during the acute phase of infection.  Positive  results are indicative of active infection with SARS-CoV-2.  Clinical  correlation with patient history and other diagnostic information is  necessary to determine patient infection status.  Positive results do  not rule out bacterial infection or co-infection with other viruses. If result is PRESUMPTIVE POSTIVE SARS-CoV-2 nucleic acids MAY BE PRESENT.   A presumptive positive result was obtained on the submitted specimen   and confirmed on repeat testing.  While 2019 novel coronavirus  (SARS-CoV-2) nucleic acids may be present in the submitted sample  additional confirmatory testing may be necessary for epidemiological  and / or clinical management purposes  to differentiate between  SARS-CoV-2 and other Sarbecovirus currently known to infect humans.  If clinically indicated additional testing with an alternate test  methodolog y [phone]([id number]) is advised. The SARS-CoV-2 RNA is generally  detectable in upper and lower respiratory specimens during the acute  phase of infection. The expected result is Negative. Fact Sheet for Patients:  BoilerBrush.com.cyhttps://www.fda.gov/media/136312/download Fact Sheet for Healthcare Providers: https://pope.com/https://www.fda.gov/media/136313/download This test is not yet approved or cleared by the [place] FDA and has been authorized for detection and/or diagnosis of SARS-CoV-2 by FDA under an Emergency Use Authorization (EUA).  This EUA will remain in effect (meaning this test can be used) for the duration of the COVID-19 declaration under Section 564(b)(1) of the Act, 21 U.S.C. section 360bbb-3(b)(1), unless the authorization is terminated or revoked sooner. Performed at [facility], [place] [address], [place],  [address]      Scheduled Meds: . dexamethasone (DECADRON) injection  6 mg Intravenous Daily  . enoxaparin (LOVENOX) injection  40 mg Subcutaneous Daily   Continuous Infusions: . sodium chloride 100 mL/hr at 05/21/19 0400     LOS: 0 days   Cherene Altes, MD [facility] Office  [phone] Pager - Text Page per Shea Evans  If 7PM-7AM, please contact night-coverage per Amion 05/21/2019, 9:03 AM

## 2019-05-21 NOTE — H&P (Signed)
History and Physical    Troy ForsterBarry G Sivertson WUJ:[id number] DOB: 02/24/1971 DOA: 05/20/2019  PCP: Patient, No Pcp Per  Patient coming from: Home  I have personally briefly reviewed patient's old medical records [facility]  Chief Complaint: Chest pain and shortness of breath  HPI: Troy Calderon is a 48 y.o. male with medical history significant of hypertension who presents with chest pain and increasing shortness of breath.  He recently tested positive for COVID infection a few days ago at an outside clinic.  Patient notes that for the past 2 days he has had midsternal pressure chest pain and some shortness of breath that would come and go at rest.  Not worse with exertion.  Denies any diaphoresis.  No nausea, vomiting.  Has had low appetite.  Patient initially had a syncopal episode in triage and was found to be hypotensive down to 90 over 60s and tachycardic up to 110.  ED Course: He was febrile with temperature of 100.8 and initially was hypotensive with blood pressure of 90 over 60s.  Comfortable on room air.CBC showed no leukocytosis or anemia.  BMP showed elevated creatinine of 1.96 (patient reports last lab in June of 1.09) EKG showed T wave changes in inferior leads.  Troponin of 6.  CTA chest did not show any pulmonary embolus but was concerning for multifocal pneumonia.   Review of Systems:  Constitutional: + Fever ENT/Mouth: No sore throat, No Rhinorrhea Eyes: No Eye Pain, No Vision Changes Cardiovascular: + Chest Pain, + SOB, , No Dyspnea on Exertion,  Respiratory: + Cough, No Sputum, No Wheezing, Dyspnea  Gastrointestinal: No Nausea, No Vomiting,  Genitourinary: no Urinary Incontinence, No Urgency, No Flank Pain Musculoskeletal: No Arthralgias, No Myalgias Skin: No Skin Lesions, No Pruritus, Neuro: no Weakness, No Numbness,  No Loss of Consciousness, No Syncope Psych: No Anxiety/Panic, No Depression, decrease appetite Heme/Lymph: No Bruising, No Bleeding  Past Medical  History:  Diagnosis Date   Hypertension     Past Surgical History:  Procedure Laterality Date   BACK SURGERY       reports that he has never smoked. He has never used smokeless tobacco. He reports current alcohol use. He reports that he does not use drugs.  No Known Allergies  No family history on file. Family history reviewed and not pertinent.  Prior to Admission medications   Medication Sig Start Date End Date Taking? Authorizing Provider  cyclobenzaprine (FLEXERIL) 5 MG tablet Take 1 tablet (5 mg total) by mouth 3 (three) times daily as needed for muscle spasms. 09/15/13   Linna HoffKindl, James D, MD  ibuprofen (ADVIL,MOTRIN) 800 MG tablet Take 1 tablet (800 mg total) by mouth 3 (three) times daily. For pain 09/15/13   Linna HoffKindl, James D, MD  methocarbamol (ROBAXIN) 500 MG tablet Take 1 tablet (500 mg total) by mouth 2 (two) times daily. 08/03/16   Janne NapoleonNeese, Hope M, NP  traMADol (ULTRAM) 50 MG tablet Take 1 tablet (50 mg total) by mouth every 6 (six) hours as needed. 08/03/16   Janne NapoleonNeese, Hope M, NP    Physical Exam: Vitals:   05/21/19 0000 05/21/19 0015 05/21/19 0030 05/21/19 0100  BP:   112/67 127/82  Pulse:      Resp: (!) 26 (!) 25 (!) 26 (!) 24  Temp:      TempSrc:      SpO2: 96% 95% 96% 94%  Weight:      Height:        Constitutional: NAD, calm, comfortable  Vitals:   05/21/19 0000 05/21/19 0015 05/21/19 0030 05/21/19 0100  BP:   112/67 127/82  Pulse:      Resp: (!) 26 (!) 25 (!) 26 (!) 24  Temp:      TempSrc:      SpO2: 96% 95% 96% 94%  Weight:      Height:       Eyes: PERRL, lids and conjunctivae normal ENMT: Mucous membranes are moist. Posterior pharynx clear of any exudate or lesions.Normal dentition.  Neck: normal, supple, no masses, no thyromegaly Respiratory: clear to auscultation bilaterally, no wheezing, no crackles. Normal respiratory effort. No accessory muscle use. On room air. Occasional cough. Cardiovascular: Regular rate and rhythm, no murmurs / rubs / gallops.  No extremity edema. 2+ pedal pulses. No carotid bruits.  Abdomen: no tenderness, no masses palpated. No hepatosplenomegaly. Bowel sounds positive.  Musculoskeletal: no clubbing / cyanosis. No joint deformity upper and lower extremities. Good ROM, no contractures. Normal muscle tone.  Skin: no rashes, lesions, ulcers. No induration Neurologic: CN 2-12 grossly intact. Sensation intact, DTR normal. Strength 5/5 in all 4.  Psychiatric: Normal judgment and insight. Alert and oriented x 3. Normal mood.     Labs on Admission: I have personally reviewed following labs and imaging studies  CBC: Recent Labs  Lab 05/20/19 2019  WBC 5.4  HGB 14.3  HCT 42.4  MCV 86.9  PLT 167   Basic Metabolic Panel: Recent Labs  Lab 05/20/19 2019  NA 132*  K 3.0*  CL 96*  CO2 24  GLUCOSE 130*  BUN 13  CREATININE 1.96*  CALCIUM 8.7*   GFR: Estimated Creatinine Clearance: 63 mL/min (A) (by C-G formula based on SCr of 1.96 mg/dL (H)). Liver Function Tests: No results for input(s): AST, ALT, ALKPHOS, BILITOT, PROT, ALBUMIN in the last 168 hours. No results for input(s): LIPASE, AMYLASE in the last 168 hours. No results for input(s): AMMONIA in the last 168 hours. Coagulation Profile: No results for input(s): INR, PROTIME in the last 168 hours. Cardiac Enzymes: No results for input(s): CKTOTAL, CKMB, CKMBINDEX, TROPONINI in the last 168 hours. BNP (last 3 results) No results for input(s): PROBNP in the last 8760 hours. HbA1C: No results for input(s): HGBA1C in the last 72 hours. CBG: No results for input(s): GLUCAP in the last 168 hours. Lipid Profile: No results for input(s): CHOL, HDL, LDLCALC, TRIG, CHOLHDL, LDLDIRECT in the last 72 hours. Thyroid Function Tests: No results for input(s): TSH, T4TOTAL, FREET4, T3FREE, THYROIDAB in the last 72 hours. Anemia Panel: No results for input(s): VITAMINB12, FOLATE, FERRITIN, TIBC, IRON, RETICCTPCT in the last 72 hours. Urine analysis: No results  found for: COLORURINE, APPEARANCEUR, LABSPEC, PHURINE, GLUCOSEU, HGBUR, BILIRUBINUR, KETONESUR, PROTEINUR, UROBILINOGEN, NITRITE, LEUKOCYTESUR  Radiological Exams on Admission: Ct Angio Chest Pe W And/or Wo Contrast  Result Date: 05/21/2019 CLINICAL DATA:  48 year old male with concern for pulmonary embolism. COVID-19. EXAM: CT ANGIOGRAPHY CHEST WITH CONTRAST TECHNIQUE: Multidetector CT imaging of the chest was performed using the standard protocol during bolus administration of intravenous contrast. Multiplanar CT image reconstructions and MIPs were obtained to evaluate the vascular anatomy. CONTRAST:  OMNIPAQUE IOHEXOL 350 MG/ML SOLN COMPARISON:  Chest radiograph dated 05/20/2019 FINDINGS: Cardiovascular: There is no cardiomegaly or pericardial effusion. The thoracic aorta is unremarkable. Evaluation of the pulmonary arteries is somewhat limited due to suboptimal opacification and respiratory motion artifact. No large or central pulmonary artery embolus identified. Mediastinum/Nodes: No hilar or mediastinal adenopathy. The esophagus and the thyroid gland are grossly  unremarkable. No mediastinal fluid collection. Lungs/Pleura: Confluent bilateral predominantly peripheral and subpleural airspace opacities most consistent with multifocal pneumonia. There are a spectrum of findings in the lungs which can be seen with acute atypical infection (as well as other non-infectious etiologies). In particular, viral pneumonia (including COVID-19) should be considered in the appropriate clinical setting. There is no pleural effusion pneumothorax. The central airways are patent. Upper Abdomen: No acute abnormality. Musculoskeletal: No chest wall abnormality. No acute or significant osseous findings. Review of the MIP images confirms the above findings. IMPRESSION: 1. No CT evidence of central pulmonary artery embolus. 2. Bilateral predominantly peripheral and subpleural airspace opacities most consistent with  multifocal pneumonia. Clinical correlation and follow-up to resolution recommended. Electronically Signed   By: Anner Crete M.D.   On: 05/21/2019 00:16   Dg Chest Portable 1 View  Result Date: 05/20/2019 CLINICAL DATA:  Acute shortness of breath and chest pain. COVID-19 positive. EXAM: PORTABLE CHEST 1 VIEW COMPARISON:  10/11/2018 chest radiograph FINDINGS: The cardiomediastinal silhouette is unremarkable. Bilateral LOWER lung airspace opacities are noted, LEFT-greater-than-RIGHT. No definite pleural effusion. No pneumothorax or acute bony abnormality. IMPRESSION: Bilateral LOWER lung airspace opacities, LEFT-greater-than-RIGHT, likely representing pneumonia/infection. Electronically Signed   By: Margarette Canada M.D.   On: 05/20/2019 21:09    EKG: Independently reviewed.   Assessment/Plan  #COVID-19 pneumonia infection -Start IV steroids - Initiate transfer to [facility]  #Acute kidney injury - Last creatinine of 1.06 in June - Likely prerenal due to dehydration.  Will give IV fluids. - recheck BMP in the morning  #Hypotension - hold home antihypertensives  DVT prophylaxis: Lovenox Code Status: Full  Family Communication: No family at bedside Disposition Plan: home  Consults called:  Admission status: Observation with transfer to [facility]   If 7PM-7AM, please contact night-coverage [email or web address] Password [email or web address]  05/21/2019, 1:19 AM

## 2019-05-22 DIAGNOSIS — J1282 Pneumonia due to coronavirus disease 2019: Secondary | ICD-10-CM | POA: Diagnosis present

## 2019-05-22 DIAGNOSIS — R55 Syncope and collapse: Secondary | ICD-10-CM | POA: Diagnosis not present

## 2019-05-22 DIAGNOSIS — U071 COVID-19: Principal | ICD-10-CM

## 2019-05-22 DIAGNOSIS — I959 Hypotension, unspecified: Secondary | ICD-10-CM | POA: Diagnosis present

## 2019-05-22 DIAGNOSIS — R739 Hyperglycemia, unspecified: Secondary | ICD-10-CM | POA: Diagnosis present

## 2019-05-22 DIAGNOSIS — I119 Hypertensive heart disease without heart failure: Secondary | ICD-10-CM | POA: Diagnosis present

## 2019-05-22 DIAGNOSIS — J1289 Other viral pneumonia: Secondary | ICD-10-CM | POA: Diagnosis present

## 2019-05-22 DIAGNOSIS — J988 Other specified respiratory disorders: Secondary | ICD-10-CM | POA: Diagnosis not present

## 2019-05-22 DIAGNOSIS — E871 Hypo-osmolality and hyponatremia: Secondary | ICD-10-CM | POA: Diagnosis present

## 2019-05-22 DIAGNOSIS — Z79891 Long term (current) use of opiate analgesic: Secondary | ICD-10-CM | POA: Diagnosis not present

## 2019-05-22 DIAGNOSIS — E876 Hypokalemia: Secondary | ICD-10-CM | POA: Diagnosis present

## 2019-05-22 DIAGNOSIS — Z791 Long term (current) use of non-steroidal anti-inflammatories (NSAID): Secondary | ICD-10-CM | POA: Diagnosis not present

## 2019-05-22 DIAGNOSIS — N179 Acute kidney failure, unspecified: Secondary | ICD-10-CM

## 2019-05-22 DIAGNOSIS — E861 Hypovolemia: Secondary | ICD-10-CM | POA: Diagnosis present

## 2019-05-22 DIAGNOSIS — Z79899 Other long term (current) drug therapy: Secondary | ICD-10-CM | POA: Diagnosis not present

## 2019-05-22 DIAGNOSIS — E860 Dehydration: Secondary | ICD-10-CM | POA: Diagnosis present

## 2019-05-22 LAB — COMPREHENSIVE METABOLIC PANEL
ALT: 24 U/L (ref 0–44)
AST: 31 U/L (ref 15–41)
Albumin: 3.4 g/dL — ABNORMAL LOW (ref 3.5–5.0)
Alkaline Phosphatase: 42 U/L (ref 38–126)
Anion gap: 8 (ref 5–15)
BUN: 14 mg/dL (ref 6–20)
CO2: 26 mmol/L (ref 22–32)
Calcium: 7.9 mg/dL — ABNORMAL LOW (ref 8.9–10.3)
Chloride: 100 mmol/L (ref 98–111)
Creatinine, Ser: 1.21 mg/dL (ref 0.61–1.24)
GFR calc Af Amer: >60 mL/min (ref >60)
GFR calc non Af Amer: >60 mL/min (ref >60)
Glucose, Bld: 118 mg/dL — ABNORMAL HIGH (ref 70–99)
Potassium: 3 mmol/L — ABNORMAL LOW (ref 3.5–5.1)
Sodium: 134 mmol/L — ABNORMAL LOW (ref 135–145)
Total Bilirubin: 0.8 mg/dL (ref 0.3–1.2)
Total Protein: 7.4 g/dL (ref 6.5–8.1)

## 2019-05-22 LAB — CBC
HCT: 39.3 % (ref 39.0–52.0)
Hemoglobin: 13.1 g/dL (ref 13.0–17.0)
MCH: 28.7 pg (ref 26.0–34.0)
MCHC: 33.3 g/dL (ref 30.0–36.0)
MCV: 86.2 fL (ref 80.0–100.0)
Platelets: 174 10*3/uL (ref 150–400)
RBC: 4.56 MIL/uL (ref 4.22–5.81)
RDW: 13.7 % (ref 11.5–15.5)
WBC: 4.9 10*3/uL (ref 4.0–10.5)
nRBC: 0 % (ref 0.0–0.2)

## 2019-05-22 LAB — D-DIMER, QUANTITATIVE: D-Dimer, Quant: 0.55 ug/mL-FEU — ABNORMAL HIGH (ref 0.00–0.50)

## 2019-05-22 LAB — FERRITIN: Ferritin: 401 ng/mL — ABNORMAL HIGH (ref 24–336)

## 2019-05-22 LAB — C-REACTIVE PROTEIN: CRP: 6 mg/dL — ABNORMAL HIGH (ref <1.0)

## 2019-05-22 MED ORDER — ACETAMINOPHEN 325 MG PO TABS: 650.0000 mg | ORAL_TABLET | Freq: Four times a day (QID) | ORAL | Status: DC | PRN

## 2019-05-22 MED ORDER — POTASSIUM CHLORIDE CRYS ER 20 MEQ PO TBCR
40.0000 meq | EXTENDED_RELEASE_TABLET | Freq: Two times a day (BID) | ORAL | Status: AC
  Administered 2019-05-22 (×2): 40 meq via ORAL
  Filled 2019-05-22 (×2): qty 2

## 2019-05-22 MED ORDER — ACETAMINOPHEN 500 MG PO TABS
1000.0000 mg | ORAL_TABLET | Freq: Once | ORAL | Status: AC
  Administered 2019-05-22: 02:00:00 1000 mg via ORAL
  Filled 2019-05-22: qty 2

## 2019-05-22 NOTE — Plan of Care (Signed)

## 2019-05-22 NOTE — Progress Notes (Signed)
Late Entry for 05/22/19. Patient seen and assessed. Physical assessment completed via computerized charting per [facility] policy. No acute distress noted. Side rails up times 2. Bed in lowest position and locked. Call light in reach. Continue to monitor.

## 2019-05-22 NOTE — Progress Notes (Signed)
1200: Wife [place] updated by patient while this nurse in room. Questions answered.   1440: Patient ambulated in hall. Brief visit with wife [place].

## 2019-05-22 NOTE — Progress Notes (Signed)
[place] TEAM 1 - Stepdown/ICU TEAM  Pearson ForsterBarry G Coulthard  JWJ:[id number] DOB: 1971-03-11 DOA: 05/20/2019 PCP: Patient, No Pcp Per    Brief Narrative:  48yo with a history of HTN who presented with chest pain and increasing shortness of breath.  He was diagnosed with COVID 2 to 3 days prior to his presentation at an outpatient clinic.  He reported progressive worsening of midsternal chest pressure with intermittent shortness of breath that occurs mostly at rest.  He suffered a syncopal spell in triage and was found to have a systolic blood pressure of 90 with a heart rate of 110.  He was also noted to have acute kidney injury.  CTa chest revealed no pulmonary embolism but multifocal pulmonary infiltrates.  Significant Events: [facility]  COVID-19 specific Treatment: Decadron 9/12 > Remdesivir 9/12 >  Subjective: Sats have declined from 97 to 91% since admit.  His wife was admitted to the hospital with COVID 9/12.  He states he feels about the same today.  He denies chest pain nausea vomiting or abdominal pain.  Assessment & Plan:  COVID pneumonia Continue Decadron and Remdesivir - his CT chest is worrisome -his oxygen saturations are slowly declining but he is not yet requiring supplemental oxygen - there remains a high risk of acute respiratory decline and therefore I feel ongoing inpatient treatment is indicated -plan to complete full 5-day Remdesivir course  Recent Labs  Lab 05/20/19 2313 05/22/19 0316  DDIMER 0.44 0.55*  FERRITIN 345* 401*  CRP 5.5* 6.0*  ALT 19 24  PROCALCITON <0.10  --     Acute kidney injury Baseline creatinine 1.06 in June 2020 -creatinine improving with gentle hydration -likely simple prerenal azotemia -follow with discontinuation of IV fluid  Recent Labs  Lab 05/20/19 2019 05/21/19 0119 05/22/19 0316  CREATININE 1.96* 1.51* 1.21    Hypotension - dehydration Resolved with gentle volume resuscitation  HTN Blood pressure  well controlled on Norvasc -holding ARB and HCTZ in setting of dehydration and acute kidney injury -monitor trend with probable need to further titrate medications  Hypokalemia Persists -increase supplementation - likely consequence of poor oral intake  Hyponatremia Due to hypovolemia - improving with volume resuscitation -monitor trend  Hyperglycemia Serum glucose 130 at time of presentation - A1c pending  DVT prophylaxis: Lovenox Code Status: FULL CODE Family Communication:  Disposition Plan: Telemetry bed  Consultants:  none  Antimicrobials:  none  Objective: Blood pressure 101/61, pulse 95, temperature 99.3 F (37.4 C), temperature source Oral, resp. rate 18, height 6\' 1"  (1.854 m), weight 121.6 kg, SpO2 94 %.  Intake/Output Summary (Last 24 hours) at 05/22/2019 0938 Last data filed at 05/22/2019 0400 Gross per 24 hour  Intake 671.22 ml  Output -  Net 671.22 ml   Filed Weights   05/20/19 2014  Weight: 121.6 kg    Examination: General: No acute respiratory distress -alert and conversant Lungs: Fine crackles bilateral bases persist Cardiovascular: RRR without murmur Abdomen: NT/ND, soft, bowel sounds positive, no rebound Extremities: No significant edema bilateral lower extremities  CBC: Recent Labs  Lab 05/20/19 2019 05/21/19 0119 05/22/19 0316  WBC 5.4 4.0 4.9  HGB 14.3 13.9 13.1  HCT 42.4 41.4 39.3  MCV 86.9 87.9 86.2  PLT 167 152 174   Basic Metabolic Panel: Recent Labs  Lab 05/20/19 2019 05/21/19 0045 05/21/19 0119 05/22/19 0316  NA 132*  --   --  134*  K 3.0*  --   --  3.0*  CL 96*  --   --  100  CO2 24  --   --  26  GLUCOSE 130*  --   --  118*  BUN 13  --   --  14  CREATININE 1.96*  --  1.51* 1.21  CALCIUM 8.7*  --   --  7.9*  MG  --  1.9  --   --    GFR: Estimated Creatinine Clearance: 102 mL/min (by C-G formula based on SCr of 1.21 mg/dL).  Liver Function Tests: Recent Labs  Lab 05/20/19 2313 05/22/19 0316  AST 29 31  ALT 19  24  ALKPHOS 43 42  BILITOT 0.9 0.8  PROT 7.2 7.4  ALBUMIN 3.3* 3.4*     Recent Results (from the past 240 hour(s))  SARS Coronavirus 2 [facility] order, Performed in [facility] hospital lab) Nasopharyngeal Nasopharyngeal Swab     Status: Abnormal   Collection Time: 05/21/19 12:19 AM   Specimen: Nasopharyngeal Swab  Result Value Ref Range Status   SARS Coronavirus 2 POSITIVE (A) NEGATIVE Final    Comment: RESULT CALLED TO, READ BACK BY AND VERIFIED WITH: Jamal Collin, RN AT [phone] ON 05/21/2019 BY SAINVILUS S (NOTE) If result is NEGATIVE SARS-CoV-2 target nucleic acids are NOT DETECTED. The SARS-CoV-2 RNA is generally detectable in upper and lower  respiratory specimens during the acute phase of infection. The lowest  concentration of SARS-CoV-2 viral copies this assay can detect is 250  copies / mL. A negative result does not preclude SARS-CoV-2 infection  and should not be used as the sole basis for treatment or other  patient management decisions.  A negative result may occur with  improper specimen collection / handling, submission of specimen other  than nasopharyngeal swab, presence of viral mutation(s) within the  areas targeted by this assay, and inadequate number of viral copies  (<250 copies / mL). A negative result must be combined with clinical  observations, patient history, and epidemiological information. If result is POSITIVE SARS-CoV-2 target nucleic acids ar e DETECTED. The SARS-CoV-2 RNA is generally detectable in upper and lower  respiratory specimens during the acute phase of infection.  Positive  results are indicative of active infection with SARS-CoV-2.  Clinical  correlation with patient history and other diagnostic information is  necessary to determine patient infection status.  Positive results do  not rule out bacterial infection or co-infection with other viruses. If result is PRESUMPTIVE POSTIVE SARS-CoV-2 nucleic acids MAY BE PRESENT.   A presumptive  positive result was obtained on the submitted specimen  and confirmed on repeat testing.  While 2019 novel coronavirus  (SARS-CoV-2) nucleic acids may be present in the submitted sample  additional confirmatory testing may be necessary for epidemiological  and / or clinical management purposes  to differentiate between  SARS-CoV-2 and other Sarbecovirus currently known to infect humans.  If clinically indicated additional testing with an alternate test  methodolog y [phone]) is advised. The SARS-CoV-2 RNA is generally  detectable in upper and lower respiratory specimens during the acute  phase of infection. The expected result is Negative. Fact Sheet for Patients:  StrictlyIdeas.no Fact Sheet for Healthcare Providers: BankingDealers.co.za This test is not yet approved or cleared by the [place] FDA and has been authorized for detection and/or diagnosis of SARS-CoV-2 by FDA under an Emergency Use Authorization (EUA).  This EUA will remain in effect (meaning this test can be used) for the duration of the COVID-19 declaration under Section 564(b)(1) of  the Act, 21 U.S.C. section 360bbb-3(b)(1), unless the authorization is terminated or revoked sooner. Performed at [facility], [address], [place], [place] [address]   Blood Culture (routine x 2)     Status: None (Preliminary result)   Collection Time: 05/21/19  1:16 AM   Specimen: BLOOD  Result Value Ref Range Status   Specimen Description BLOOD LEFT HAND  Final   Special Requests   Final    BOTTLES DRAWN AEROBIC AND ANAEROBIC Blood Culture adequate volume   Culture   Final    NO GROWTH 1 DAY Performed at [facility], [address], [place], [place] [address]    Report Status PENDING  Incomplete     Scheduled Meds: . amLODipine  10 mg Oral Daily  . dexamethasone (DECADRON) injection  6 mg Intravenous Daily  . enoxaparin (LOVENOX) injection  60 mg Subcutaneous  Daily   Continuous Infusions: . remdesivir 100 mg in NS 250 mL       LOS: 0 days   Lonia Blood, MD [facility] Office  [phone] Pager - Text Page per Loretha Stapler  If 7PM-7AM, please contact night-coverage per Amion 05/22/2019, 9:38 AM

## 2019-05-23 ENCOUNTER — Inpatient Hospital Stay (HOSPITAL_COMMUNITY): Payer: 59

## 2019-05-23 LAB — COMPREHENSIVE METABOLIC PANEL
ALT: 21 U/L (ref 0–44)
AST: 26 U/L (ref 15–41)
Albumin: 3.3 g/dL — ABNORMAL LOW (ref 3.5–5.0)
Alkaline Phosphatase: 43 U/L (ref 38–126)
Anion gap: 10 (ref 5–15)
BUN: 16 mg/dL (ref 6–20)
CO2: 25 mmol/L (ref 22–32)
Calcium: 8.1 mg/dL — ABNORMAL LOW (ref 8.9–10.3)
Chloride: 102 mmol/L (ref 98–111)
Creatinine, Ser: 1.17 mg/dL (ref 0.61–1.24)
GFR calc Af Amer: >60 mL/min (ref >60)
GFR calc non Af Amer: >60 mL/min (ref >60)
Glucose, Bld: 114 mg/dL — ABNORMAL HIGH (ref 70–99)
Potassium: 3.9 mmol/L (ref 3.5–5.1)
Sodium: 137 mmol/L (ref 135–145)
Total Bilirubin: 0.4 mg/dL (ref 0.3–1.2)
Total Protein: 7.7 g/dL (ref 6.5–8.1)

## 2019-05-23 LAB — CBC
HCT: 40.5 % (ref 39.0–52.0)
Hemoglobin: 13.5 g/dL (ref 13.0–17.0)
MCH: 29.4 pg (ref 26.0–34.0)
MCHC: 33.3 g/dL (ref 30.0–36.0)
MCV: 88.2 fL (ref 80.0–100.0)
Platelets: 197 10*3/uL (ref 150–400)
RBC: 4.59 MIL/uL (ref 4.22–5.81)
RDW: 13.8 % (ref 11.5–15.5)
WBC: 4.4 10*3/uL (ref 4.0–10.5)
nRBC: 0 % (ref 0.0–0.2)

## 2019-05-23 LAB — C-REACTIVE PROTEIN: CRP: 7.8 mg/dL — ABNORMAL HIGH (ref <1.0)

## 2019-05-23 LAB — FERRITIN: Ferritin: 428 ng/mL — ABNORMAL HIGH (ref 24–336)

## 2019-05-23 LAB — D-DIMER, QUANTITATIVE: D-Dimer, Quant: <0.27 ug/mL-FEU (ref 0.00–0.50)

## 2019-05-23 NOTE — Plan of Care (Signed)

## 2019-05-23 NOTE — Plan of Care (Signed)
Daughter Finlay Godbee called per patients permission and updated on status. All questions answered.

## 2019-05-23 NOTE — Progress Notes (Signed)
1130: Patient ambulated to wife room. Updated wife on condition. Wife also update previously this AM.

## 2019-05-23 NOTE — Progress Notes (Signed)
[place] TEAM 1 - Stepdown/ICU TEAM  Troy Calderon  ACZ:[id number] DOB: 1971-04-15 DOA: 05/20/2019 PCP: Patient, No Pcp Per    Brief Narrative:  48yo with a history of HTN who presented with chest pain and increasing shortness of breath.  He was diagnosed with COVID 2 to 3 days prior to his presentation at an outpatient clinic.  He reported progressive worsening of midsternal chest pressure with intermittent shortness of breath that occurs mostly at rest.  He suffered a syncopal spell in triage and was found to have a systolic blood pressure of 90 with a heart rate of 110.  He was also noted to have acute kidney injury.  CTa chest revealed no pulmonary embolism but multifocal pulmonary infiltrates.  Significant Events: 9/12 admit to [facility] via [facility] ED  COVID-19 specific Treatment: Decadron 9/12 > Remdesivir 9/12 >  Subjective: Sats have declined from 97 to 91% since admit.  I have reviewed his chest x-ray from this morning which notes progression of bilateral diffuse infiltrates since his previous x-ray 9/11.  The patient tells me he feels "about the same."  He denies severe shortness of breath.  He denies chest pain nausea or vomiting.  He is tolerating his Remdesivir infusions without difficulty.  Assessment & Plan:  COVID pneumonia Continue Decadron and Remdesivir - his CT chest is worrisome, as is his follow-up chest x-ray today - his oxygen saturations are slowly declining but he is not yet requiring supplemental oxygen - there remains a high risk of acute respiratory decline and therefore I feel ongoing inpatient treatment is indicated - plan to complete full 5-day Remdesivir course  Recent Labs  Lab 05/20/19 2313 05/22/19 0316 05/23/19 0417  DDIMER 0.44 0.55* <0.27  FERRITIN 345* 401* 428*  CRP 5.5* 6.0* 7.8*  ALT 19 24 21   PROCALCITON <0.10  --   --     Acute kidney injury Baseline creatinine 1.06 in June 2020 -creatinine improved with gentle hydration -likely  simple prerenal azotemia  Recent Labs  Lab 05/20/19 2019 05/21/19 0119 05/22/19 0316 05/23/19 0417  CREATININE 1.96* 1.51* 1.21 1.17    Hypotension - dehydration Resolved with gentle volume resuscitation  HTN Blood pressure well controlled on Norvasc -holding ARB and HCTZ in setting of dehydration and acute kidney injury -continue to monitor trend and consider resuming usual home medical therapy prior to discharge  Hypokalemia likely consequence of poor oral intake -supplemented to normal range  Hyponatremia Due to hypovolemia -corrected with volume resuscitation  Hyperglycemia Serum glucose 130 at time of presentation - A1c pending  DVT prophylaxis: Lovenox Code Status: FULL CODE Family Communication:  Disposition Plan: MedSurg bed -discharge home when Remdesivir course completed if hypoxia does not worsen  Consultants:  none  Antimicrobials:  none  Objective: Blood pressure 102/71, pulse 88, temperature 98.6 F (37 C), temperature source Oral, resp. rate (!) 21, height 6\' 1"  (1.093 m), weight 121.6 kg, SpO2 93 %.  Intake/Output Summary (Last 24 hours) at 05/23/2019 0900 Last data filed at 05/22/2019 1333 Gross per 24 hour  Intake 440 ml  Output -  Net 440 ml   Filed Weights   05/20/19 2014  Weight: 121.6 kg    Examination: General: No acute respiratory distress  Lungs: Crackles bilateral bases persist with good air movement throughout other fields Cardiovascular: RRR  Abdomen: NT/ND, soft, bowel sounds positive, no rebound Extremities: No edema B LE   CBC: Recent Labs  Lab 05/21/19 0119 05/22/19 0316 05/23/19 0417  WBC 4.0  4.9 4.4  HGB 13.9 13.1 13.5  HCT 41.4 39.3 40.5  MCV 87.9 86.2 88.2  PLT 152 174 197   Basic Metabolic Panel: Recent Labs  Lab 05/20/19 2019 05/21/19 0045 05/21/19 0119 05/22/19 0316 05/23/19 0417  NA 132*  --   --  134* 137  K 3.0*  --   --  3.0* 3.9  CL 96*  --   --  100 102  CO2 24  --   --  26 25  GLUCOSE 130*   --   --  118* 114*  BUN 13  --   --  14 16  CREATININE 1.96*  --  1.51* 1.21 1.17  CALCIUM 8.7*  --   --  7.9* 8.1*  MG  --  1.9  --   --   --    GFR: Estimated Creatinine Clearance: 105.5 mL/min (by C-G formula based on SCr of 1.17 mg/dL).  Liver Function Tests: Recent Labs  Lab 05/20/19 2313 05/22/19 0316 05/23/19 0417  AST 29 31 26   ALT 19 24 21   ALKPHOS 43 42 43  BILITOT 0.9 0.8 0.4  PROT 7.2 7.4 7.7  ALBUMIN 3.3* 3.4* 3.3*     Recent Results (from the past 240 hour(s))  SARS Coronavirus 2 [facility] order, Performed in [facility] hospital lab) Nasopharyngeal Nasopharyngeal Swab     Status: Abnormal   Collection Time: 05/21/19 12:19 AM   Specimen: Nasopharyngeal Swab  Result Value Ref Range Status   SARS Coronavirus 2 POSITIVE (A) NEGATIVE Final    Comment: RESULT CALLED TO, READ BACK BY AND VERIFIED WITH: Santiago Bumpers, RN AT [phone] ON 05/21/2019 BY SAINVILUS S (NOTE) If result is NEGATIVE SARS-CoV-2 target nucleic acids are NOT DETECTED. The SARS-CoV-2 RNA is generally detectable in upper and lower  respiratory specimens during the acute phase of infection. The lowest  concentration of SARS-CoV-2 viral copies this assay can detect is 250  copies / mL. A negative result does not preclude SARS-CoV-2 infection  and should not be used as the sole basis for treatment or other  patient management decisions.  A negative result may occur with  improper specimen collection / handling, submission of specimen other  than nasopharyngeal swab, presence of viral mutation(s) within the  areas targeted by this assay, and inadequate number of viral copies  (<250 copies / mL). A negative result must be combined with clinical  observations, patient history, and epidemiological information. If result is POSITIVE SARS-CoV-2 target nucleic acids ar e DETECTED. The SARS-CoV-2 RNA is generally detectable in upper and lower  respiratory specimens during the acute phase of infection.  Positive   results are indicative of active infection with SARS-CoV-2.  Clinical  correlation with patient history and other diagnostic information is  necessary to determine patient infection status.  Positive results do  not rule out bacterial infection or co-infection with other viruses. If result is PRESUMPTIVE POSTIVE SARS-CoV-2 nucleic acids MAY BE PRESENT.   A presumptive positive result was obtained on the submitted specimen  and confirmed on repeat testing.  While 2019 novel coronavirus  (SARS-CoV-2) nucleic acids may be present in the submitted sample  additional confirmatory testing may be necessary for epidemiological  and / or clinical management purposes  to differentiate between  SARS-CoV-2 and other Sarbecovirus currently known to infect humans.  If clinically indicated additional testing with an alternate test  methodolog y [id number]) is advised. The SARS-CoV-2 RNA is generally  detectable in upper and lower respiratory specimens  during the acute  phase of infection. The expected result is Negative. Fact Sheet for Patients:  BoilerBrush.com.cyhttps://www.fda.gov/media/136312/download Fact Sheet for Healthcare Providers: https://pope.com/https://www.fda.gov/media/136313/download This test is not yet approved or cleared by the [place] FDA and has been authorized for detection and/or diagnosis of SARS-CoV-2 by FDA under an Emergency Use Authorization (EUA).  This EUA will remain in effect (meaning this test can be used) for the duration of the COVID-19 declaration under Section 564(b)(1) of the Act, 21 U.S.C. section 360bbb-3(b)(1), unless the authorization is terminated or revoked sooner. Performed at [facility], [address] [place], [place] [address]   Blood Culture (routine x 2)     Status: None (Preliminary result)   Collection Time: 05/21/19  1:16 AM   Specimen: BLOOD  Result Value Ref Range Status   Specimen Description BLOOD LEFT HAND  Final   Special Requests   Final    BOTTLES DRAWN  AEROBIC AND ANAEROBIC Blood Culture adequate volume   Culture   Final    NO GROWTH 1 DAY Performed at [facility], [address] [place], [place] [address]    Report Status PENDING  Incomplete     Scheduled Meds: . amLODipine  10 mg Oral Daily  . dexamethasone (DECADRON) injection  6 mg Intravenous Daily  . enoxaparin (LOVENOX) injection  60 mg Subcutaneous Daily   Continuous Infusions: . remdesivir 100 mg in NS 250 mL 100 mg (05/22/19 1334)     LOS: 1 day   Lonia BloodJeffrey T. McClung, MD [facility] Office  [phone] Pager - Text Page per Loretha StaplerAmion  If 7PM-7AM, please contact night-coverage per Amion 05/23/2019, 9:00 AM

## 2019-05-24 LAB — CBC
HCT: 40.9 % (ref 39.0–52.0)
Hemoglobin: 13.4 g/dL (ref 13.0–17.0)
MCH: 28.7 pg (ref 26.0–34.0)
MCHC: 32.8 g/dL (ref 30.0–36.0)
MCV: 87.6 fL (ref 80.0–100.0)
Platelets: 238 10*3/uL (ref 150–400)
RBC: 4.67 MIL/uL (ref 4.22–5.81)
RDW: 13.5 % (ref 11.5–15.5)
WBC: 4.2 10*3/uL (ref 4.0–10.5)
nRBC: 0 % (ref 0.0–0.2)

## 2019-05-24 LAB — FERRITIN: Ferritin: 380 ng/mL — ABNORMAL HIGH (ref 24–336)

## 2019-05-24 LAB — COMPREHENSIVE METABOLIC PANEL
ALT: 19 U/L (ref 0–44)
AST: 22 U/L (ref 15–41)
Albumin: 3.1 g/dL — ABNORMAL LOW (ref 3.5–5.0)
Alkaline Phosphatase: 41 U/L (ref 38–126)
Anion gap: 8 (ref 5–15)
BUN: 14 mg/dL (ref 6–20)
CO2: 26 mmol/L (ref 22–32)
Calcium: 8.2 mg/dL — ABNORMAL LOW (ref 8.9–10.3)
Chloride: 102 mmol/L (ref 98–111)
Creatinine, Ser: 1.02 mg/dL (ref 0.61–1.24)
GFR calc Af Amer: >60 mL/min (ref >60)
GFR calc non Af Amer: >60 mL/min (ref >60)
Glucose, Bld: 112 mg/dL — ABNORMAL HIGH (ref 70–99)
Potassium: 3.6 mmol/L (ref 3.5–5.1)
Sodium: 136 mmol/L (ref 135–145)
Total Bilirubin: 0.6 mg/dL (ref 0.3–1.2)
Total Protein: 7.3 g/dL (ref 6.5–8.1)

## 2019-05-24 LAB — D-DIMER, QUANTITATIVE: D-Dimer, Quant: 0.29 ug/mL-FEU (ref 0.00–0.50)

## 2019-05-24 LAB — C-REACTIVE PROTEIN: CRP: 3.8 mg/dL — ABNORMAL HIGH (ref <1.0)

## 2019-05-24 LAB — HEMOGLOBIN A1C
Hgb A1c MFr Bld: 6.1 % — ABNORMAL HIGH (ref 4.8–5.6)
Mean Plasma Glucose: 128 mg/dL

## 2019-05-24 NOTE — Plan of Care (Signed)
Pt independent w/ ADLs.  Up in chair several hours; independent use of IS; encouraged self proning throughout day and at bedtime as much as possible.

## 2019-05-24 NOTE — Plan of Care (Signed)
Daughter Melroy Bougher called and updated on patient status. Pt. Requested to call daughter. All questions answered.

## 2019-05-24 NOTE — Progress Notes (Signed)
[place] TEAM 1 - Stepdown/ICU TEAM  Pearson ForsterBarry G Hill  ZOX:[id number] DOB: [birth year] DOA: 05/20/2019 PCP: Shirlean MylarWebb, Carol, MD    Brief Narrative:  [phone] with a history of HTN who presented with chest pain and increasing shortness of breath.  He was diagnosed with COVID 2 to 3 days prior to his presentation at an outpatient clinic.  He reported progressive worsening of midsternal chest pressure with intermittent shortness of breath that occurs mostly at rest.  He suffered a syncopal spell in triage and was found to have a systolic blood pressure of 90 with a heart rate of 110.  He was also noted to have acute kidney injury.  CTa chest revealed no pulmonary embolism but multifocal pulmonary infiltrates.  Significant Events: [facility]  COVID-19 specific Treatment: Decadron 9/12 > Remdesivir 9/12 >  Subjective: Sats have declined from 97% to 88% since admit, though he remains on RA.  At the time of my exam the patient is resting comfortably on room air with saturations at 90 to 94%.  He denies chest pain nausea or vomiting.  He has not yet been up and out of bed much.  He did admit to feeling significantly short of breath last night.  I have spoken to the patient at length about our ongoing treatment.  I have explained to him the option of using convalescent plasma should his hypoxia progress further.  I have provided him with a copy of the consent form for him to read over.  We agreed we will discuss this further should his clinical condition decline further.  Assessment & Plan:  COVID pneumonia Continue Decadron and Remdesivir - his CT chest is worrisome, as was his follow-up chest x-ray 9/14 - his oxygen saturations are slowly declining but he is not yet requiring supplemental oxygen - there remains a high risk of acute respiratory decline and therefore I feel ongoing inpatient treatment is indicated - plan to complete full 5-day Remdesivir course -consider convalescent  plasma should his condition worsen (he possesses a copy of the consent form which he will simply need to sign" -repeat CXR in a.m.  Recent Labs  Lab 05/20/19 2313 05/22/19 0316 05/23/19 0417 05/24/19 0335  DDIMER 0.44 0.55* <0.27 0.29  FERRITIN 345* 401* 428* 380*  CRP 5.5* 6.0* 7.8* 3.8*  ALT 19 24 21 19   PROCALCITON <0.10  --   --   --     Acute kidney injury Baseline creatinine reportedly 1.06 in June 2020 - creatinine improved with gentle hydration -likely simple prerenal azotemia  Recent Labs  Lab 05/20/19 2019 05/21/19 0119 05/22/19 0316 05/23/19 0417 05/24/19 0335  CREATININE 1.96* 1.51* 1.21 1.17 1.02    Hypotension - dehydration Resolved with gentle volume resuscitation  HTN Blood pressure well controlled on Norvasc -holding ARB and HCTZ in setting of dehydration and acute kidney injury -continue to monitor trend and consider resuming usual home medical therapy prior to discharge  Hypokalemia likely consequence of poor oral intake - supplemented to normal range  Hyponatremia Due to hypovolemia - corrected with volume resuscitation  Hyperglycemia Serum glucose 130 at time of presentation - A1c not consistent with diabetes at 6.1  DVT prophylaxis: Lovenox Code Status: FULL CODE Family Communication:  Disposition Plan: MedSurg bed -encouraged patient to ambulate and sit up in chair -encourage patient to use incentive spirometer all day long  Consultants:  none  Antimicrobials:  none  Objective: Blood pressure 133/84, pulse 83, temperature 98.1 F (  36.7 C), temperature source Oral, resp. rate 19, height 6\' 1"  (1.854 m), weight 121.6 kg, SpO2 (!) 88 %.  Intake/Output Summary (Last 24 hours) at 05/24/2019 0854 Last data filed at 05/23/2019 2100 Gross per 24 hour  Intake 803.16 ml  Output -  Net 803.16 ml   Filed Weights   05/20/19 2014  Weight: 121.6 kg    Examination: General: No acute respiratory distress  Lungs: Crackles bilateral bases  -good air movement throughout other fields -no wheezing Cardiovascular: RRR -no rub or murmur Abdomen: NT/ND, soft, bowel sounds positive, no rebound Extremities: No edema bilateral lower extremities  CBC: Recent Labs  Lab 05/22/19 0316 05/23/19 0417 05/24/19 0335  WBC 4.9 4.4 4.2  HGB 13.1 13.5 13.4  HCT 39.3 40.5 40.9  MCV 86.2 88.2 87.6  PLT 174 197 119   Basic Metabolic Panel: Recent Labs  Lab 05/21/19 0045  05/22/19 0316 05/23/19 0417 05/24/19 0335  NA  --   --  134* 137 136  K  --   --  3.0* 3.9 3.6  CL  --   --  100 102 102  CO2  --   --  26 25 26   GLUCOSE  --   --  118* 114* 112*  BUN  --   --  14 16 14   CREATININE  --    < > 1.21 1.17 1.02  CALCIUM  --   --  7.9* 8.1* 8.2*  MG 1.9  --   --   --   --    < > = values in this interval not displayed.   GFR: Estimated Creatinine Clearance: 121 mL/min (by C-G formula based on SCr of 1.02 mg/dL).  Liver Function Tests: Recent Labs  Lab 05/20/19 2313 05/22/19 0316 05/23/19 0417 05/24/19 0335  AST 29 31 26 22   ALT 19 24 21 19   ALKPHOS 43 42 43 41  BILITOT 0.9 0.8 0.4 0.6  PROT 7.2 7.4 7.7 7.3  ALBUMIN 3.3* 3.4* 3.3* 3.1*     Recent Results (from the past 240 hour(s))  SARS Coronavirus 2 [facility] order, Performed in [facility] hospital lab) Nasopharyngeal Nasopharyngeal Swab     Status: Abnormal   Collection Time: 05/21/19 12:19 AM   Specimen: Nasopharyngeal Swab  Result Value Ref Range Status   SARS Coronavirus 2 POSITIVE (A) NEGATIVE Final    Comment: RESULT CALLED TO, READ BACK BY AND VERIFIED WITH: Jamal Collin, RN AT [phone] ON 05/21/2019 BY SAINVILUS S (NOTE) If result is NEGATIVE SARS-CoV-2 target nucleic acids are NOT DETECTED. The SARS-CoV-2 RNA is generally detectable in upper and lower  respiratory specimens during the acute phase of infection. The lowest  concentration of SARS-CoV-2 viral copies this assay can detect is 250  copies / mL. A negative result does not preclude SARS-CoV-2 infection   and should not be used as the sole basis for treatment or other  patient management decisions.  A negative result may occur with  improper specimen collection / handling, submission of specimen other  than nasopharyngeal swab, presence of viral mutation(s) within the  areas targeted by this assay, and inadequate number of viral copies  (<250 copies / mL). A negative result must be combined with clinical  observations, patient history, and epidemiological information. If result is POSITIVE SARS-CoV-2 target nucleic acids ar e DETECTED. The SARS-CoV-2 RNA is generally detectable in upper and lower  respiratory specimens during the acute phase of infection.  Positive  results are indicative of active infection  with SARS-CoV-2.  Clinical  correlation with patient history and other diagnostic information is  necessary to determine patient infection status.  Positive results do  not rule out bacterial infection or co-infection with other viruses. If result is PRESUMPTIVE POSTIVE SARS-CoV-2 nucleic acids MAY BE PRESENT.   A presumptive positive result was obtained on the submitted specimen  and confirmed on repeat testing.  While 2019 novel coronavirus  (SARS-CoV-2) nucleic acids may be present in the submitted sample  additional confirmatory testing may be necessary for epidemiological  and / or clinical management purposes  to differentiate between  SARS-CoV-2 and other Sarbecovirus currently known to infect humans.  If clinically indicated additional testing with an alternate test  methodolog y [phone]) is advised. The SARS-CoV-2 RNA is generally  detectable in upper and lower respiratory specimens during the acute  phase of infection. The expected result is Negative. Fact Sheet for Patients:  BoilerBrush.com.cy Fact Sheet for Healthcare Providers: https://pope.com/ This test is not yet approved or cleared by the [place] FDA and  has been authorized for detection and/or diagnosis of SARS-CoV-2 by FDA under an Emergency Use Authorization (EUA).  This EUA will remain in effect (meaning this test can be used) for the duration of the COVID-19 declaration under Section 564(b)(1) of the Act, 21 U.S.C. section 360bbb-3(b)(1), unless the authorization is terminated or revoked sooner. Performed at [facility], [address], [place], [place] [address]   Blood Culture (routine x 2)     Status: None (Preliminary result)   Collection Time: 05/21/19  1:16 AM   Specimen: BLOOD  Result Value Ref Range Status   Specimen Description BLOOD LEFT HAND  Final   Special Requests   Final    BOTTLES DRAWN AEROBIC AND ANAEROBIC Blood Culture adequate volume   Culture   Final    NO GROWTH 2 DAYS Performed at [facility], [address], [place], [place] [address]    Report Status PENDING  Incomplete     Scheduled Meds: . amLODipine  10 mg Oral Daily  . dexamethasone (DECADRON) injection  6 mg Intravenous Daily  . enoxaparin (LOVENOX) injection  60 mg Subcutaneous Daily   Continuous Infusions: . remdesivir 100 mg in NS 250 mL Stopped (05/23/19 1357)     LOS: 2 days   Lonia Blood, MD [facility] Office  [phone] Pager - Text Page per Loretha Stapler  If 7PM-7AM, please contact night-coverage per Amion 05/24/2019, 8:54 AM

## 2019-05-25 ENCOUNTER — Inpatient Hospital Stay (HOSPITAL_COMMUNITY): Payer: 59

## 2019-05-25 DIAGNOSIS — R55 Syncope and collapse: Secondary | ICD-10-CM

## 2019-05-25 DIAGNOSIS — E876 Hypokalemia: Secondary | ICD-10-CM

## 2019-05-25 LAB — CBC
HCT: 40.7 % (ref 39.0–52.0)
Hemoglobin: 13.7 g/dL (ref 13.0–17.0)
MCH: 29.3 pg (ref 26.0–34.0)
MCHC: 33.7 g/dL (ref 30.0–36.0)
MCV: 87 fL (ref 80.0–100.0)
Platelets: 277 10*3/uL (ref 150–400)
RBC: 4.68 MIL/uL (ref 4.22–5.81)
RDW: 13.4 % (ref 11.5–15.5)
WBC: 5.8 10*3/uL (ref 4.0–10.5)
nRBC: 0 % (ref 0.0–0.2)

## 2019-05-25 LAB — COMPREHENSIVE METABOLIC PANEL
ALT: 21 U/L (ref 0–44)
AST: 20 U/L (ref 15–41)
Albumin: 3.2 g/dL — ABNORMAL LOW (ref 3.5–5.0)
Alkaline Phosphatase: 42 U/L (ref 38–126)
Anion gap: 8 (ref 5–15)
BUN: 14 mg/dL (ref 6–20)
CO2: 24 mmol/L (ref 22–32)
Calcium: 8.5 mg/dL — ABNORMAL LOW (ref 8.9–10.3)
Chloride: 103 mmol/L (ref 98–111)
Creatinine, Ser: 1 mg/dL (ref 0.61–1.24)
GFR calc Af Amer: >60 mL/min (ref >60)
GFR calc non Af Amer: >60 mL/min (ref >60)
Glucose, Bld: 114 mg/dL — ABNORMAL HIGH (ref 70–99)
Potassium: 4.2 mmol/L (ref 3.5–5.1)
Sodium: 135 mmol/L (ref 135–145)
Total Bilirubin: 0.6 mg/dL (ref 0.3–1.2)
Total Protein: 7.1 g/dL (ref 6.5–8.1)

## 2019-05-25 LAB — C-REACTIVE PROTEIN: CRP: 1.8 mg/dL — ABNORMAL HIGH (ref <1.0)

## 2019-05-25 LAB — FERRITIN: Ferritin: 386 ng/mL — ABNORMAL HIGH (ref 24–336)

## 2019-05-25 LAB — D-DIMER, QUANTITATIVE: D-Dimer, Quant: 0.31 ug/mL-FEU (ref 0.00–0.50)

## 2019-05-25 MED ORDER — DEXAMETHASONE 6 MG PO TABS: 6.0000 mg | ORAL_TABLET | Freq: Every day | ORAL | 0 refills | Status: AC

## 2019-05-25 NOTE — Discharge Summary (Signed)
Physician Discharge Summary  Pearson ForsterBarry G Neeb ZOX:[id number] DOB: 05/27/1971 DOA: 05/20/2019  PCP: Shirlean MylarWebb, Carol, MD  Admit date: 05/20/2019 Discharge date: 05/25/2019  Admitted From: Home Disposition: Home   Recommendations for Outpatient Follow-up:  1. Follow up with PCP in 2 weeks 2. Please obtain CMP/CBC in one week 3. Recommend sleep study following recovery from covid-19.   Home Health: None Equipment/Devices: None Discharge Condition: Stable CODE STATUS: Full Diet recommendation: Heart healthy  Brief/Interim Summary: 48yo with a history of HTN who presented with chest pain and increasing shortness of breath.  He was diagnosed with COVID 2 to 3 days prior to his presentation at an outpatient clinic.  He reported progressive worsening of midsternal chest pressure with intermittent shortness of breath that occurs mostly at rest.  He suffered a syncopal spell in triage and was found to have a systolic blood pressure of 90 with a heart rate of 110.  He was also noted to have acute kidney injury.  CTA chest revealed no pulmonary embolism but multifocal pulmonary infiltrates. The patient [facility] and started on remdesivir and steroids with subsequent improvement in symptoms.   Discharge Diagnoses:  Active Problems:   Acute kidney injury (HCC)   Pneumonia due to COVID-19 virus  COVID pneumonia: Completed remdesivir, continue steroids po at home. Isolation guidance provided. D-dimer and CRP trended downward, PCT undetectable.   Acute kidney injury: Resolved, likely simple prerenal azotemia  Hypotension due to dehydration: Resolved with gentle volume resuscitation  HTN:  - Can restart home medications.   Hypokalemia: Likely consequence of poor oral intake, supplemented to normal range.  Hyponatremia, hypovolemic: Resolved with volume resuscitation.   Hyperglycemia: Serum glucose 130 at time of presentation. HbA1c not consistent with diabetes at 6.1%. -  Carb-modified diet recommended.    Discharge Instructions Discharge Instructions    Diet - low sodium heart healthy   Complete by: As directed    Discharge instructions   Complete by: As directed    You are being discharged from the hospital after treatment for covid-19 infection. You are felt to be stable enough to no longer require inpatient monitoring, testing, and treatment, though you will need to follow the recommendations below: - Continue taking decadron (steroid) for 5 more days to complete the course.  - A sleep study is recommended to evaluate for possible sleep apnea once you have completely recovered from covid. - Remain in self-isolation for 14 days following discharge to reduce risk of transmission of the virus.  - Do not take NSAID medications (including, but not limited to, ibuprofen, advil, motrin, naproxen, aleve, goody's powder, etc.) - Follow up with your doctor in the next week via telehealth or seek medical attention right away if your symptoms get WORSE.  - Consider donating plasma after you have recovered (either 14 days after a negative test or 28 days after symptoms have completely resolved) because your antibodies to this virus may be helpful to give to others with life-threatening infections. Please go to the website www.oneblood.org if you would like to consider volunteering for plasma donation.    Directions for you at home:  Wear a facemask You should wear a facemask that covers your nose and mouth when you are in the same room with other people and when you visit a healthcare provider. People who live with or visit you should also wear a facemask while they are in the same room with you.  Separate yourself from other people in your home As  much as possible, you should stay in a different room from other people in your home. Also, you should use a separate bathroom, if available.  Avoid sharing household items You should not share dishes, drinking glasses,  cups, eating utensils, towels, bedding, or other items with other people in your home. After using these items, you should wash them thoroughly with soap and water.  Cover your coughs and sneezes Cover your mouth and nose with a tissue when you cough or sneeze, or you can cough or sneeze into your sleeve. Throw used tissues in a lined trash can, and immediately wash your hands with soap and water for at least 20 seconds or use an alcohol-based hand rub.  Wash your [employer] your hands often and thoroughly with soap and water for at least 20 seconds. You can use an alcohol-based hand sanitizer if soap and water are not available and if your hands are not visibly dirty. Avoid touching your eyes, nose, and mouth with unwashed hands.  Directions for those who live with, or provide care at home for you:  Limit the number of people who have contact with the patient If possible, have only one caregiver for the patient. Other household members should stay in another home or place of residence. If this is not possible, they should stay in another room, or be separated from the patient as much as possible. Use a separate bathroom, if available. Restrict visitors who do not have an essential need to be in the home.  Ensure good ventilation Make sure that shared spaces in the home have good air flow, such as from an air conditioner or an opened window, weather permitting.  Wash your hands often Wash your hands often and thoroughly with soap and water for at least 20 seconds. You can use an alcohol based hand sanitizer if soap and water are not available and if your hands are not visibly dirty. Avoid touching your eyes, nose, and mouth with unwashed hands. Use disposable paper towels to dry your hands. If not available, use dedicated cloth towels and replace them when they become wet.  Wear a facemask and gloves Wear a disposable facemask at all times in the room and gloves when you touch or have  contact with the patient's blood, body fluids, and/or secretions or excretions, such as sweat, saliva, sputum, nasal mucus, vomit, urine, or feces.  Ensure the mask fits over your nose and mouth tightly, and do not touch it during use. Throw out disposable facemasks and gloves after using them. Do not reuse. Wash your hands immediately after removing your facemask and gloves. If your personal clothing becomes contaminated, carefully remove clothing and launder. Wash your hands after handling contaminated clothing. Place all used disposable facemasks, gloves, and other waste in a lined container before disposing them with other household waste. Remove gloves and wash your hands immediately after handling these items.  Do not share dishes, glasses, or other household items with the patient Avoid sharing household items. You should not share dishes, drinking glasses, cups, eating utensils, towels, bedding, or other items with a patient who is confirmed to have, or being evaluated for, COVID-19 infection. After the person uses these items, you should wash them thoroughly with soap and water.  Wash laundry thoroughly Immediately remove and wash clothes or bedding that have blood, body fluids, and/or secretions or excretions, such as sweat, saliva, sputum, nasal mucus, vomit, urine, or feces, on them. Wear gloves when handling laundry from the patient.  Read and follow directions on labels of laundry or clothing items and detergent. In general, wash and dry with the warmest temperatures recommended on the label.  Clean all areas the individual has used often Clean all touchable surfaces, such as counters, tabletops, doorknobs, bathroom fixtures, toilets, phones, keyboards, tablets, and bedside tables, every day. Also, clean any surfaces that may have blood, body fluids, and/or secretions or excretions on them. Wear gloves when cleaning surfaces the patient has come in contact with. Use a diluted bleach  solution (e.g., dilute bleach with 1 part bleach and 10 parts water) or a household disinfectant with a label that says EPA-registered for coronaviruses. To make a bleach solution at home, add 1 tablespoon of bleach to 1 quart (4 cups) of water. For a larger supply, add  cup of bleach to 1 gallon (16 cups) of water. Read labels of cleaning products and follow recommendations provided on product labels. Labels contain instructions for safe and effective use of the cleaning product including precautions you should take when applying the product, such as wearing gloves or eye protection and making sure you have good ventilation during use of the product. Remove gloves and wash hands immediately after cleaning.  Monitor yourself for signs and symptoms of illness Caregivers and household members are considered close contacts, should monitor their health, and will be asked to limit movement outside of the home to the extent possible. Follow the monitoring steps for close contacts listed on the symptom monitoring form.  If you have additional questions, contact your local health department or call the epidemiologist on call at [phone] (available 24/7). This guidance is subject to change. For the most up-to-date guidance from CDC, please refer to their website: YouBlogs.pl     Allergies as of 05/25/2019   No Known Allergies     Medication List    TAKE these medications   acetaminophen 500 MG tablet Commonly known as: TYLENOL Take 1,000 mg by mouth every 6 (six) hours as needed for mild pain or headache.   dexamethasone 6 MG tablet Commonly known as: Decadron Take 1 tablet (6 mg total) by mouth daily for 5 days. Start taking on: May 26, 2019   hydrochlorothiazide 12.5 MG capsule Commonly known as: MICROZIDE Take 12.5 mg by mouth daily.   MULTI-DAY PO Take 1 tablet by mouth daily. Vit C,  Elderberry   olmesartan 20  MG tablet Commonly known as: BENICAR Take 20 mg by mouth daily.      Follow-up Information    Maurice Small, MD. Schedule an appointment as soon as possible for a visit in 2 week(s).   Specialty: Family Medicine Contact information: [address] [address] [phone]          No Known Allergies  Consultations:  None  Procedures/Studies: Ct Angio Chest Pe W And/or Wo Contrast  Result Date: 05/21/2019 CLINICAL DATA:  48 year old male with concern for pulmonary embolism. COVID-19. EXAM: CT ANGIOGRAPHY CHEST WITH CONTRAST TECHNIQUE: Multidetector CT imaging of the chest was performed using the standard protocol during bolus administration of intravenous contrast. Multiplanar CT image reconstructions and MIPs were obtained to evaluate the vascular anatomy. CONTRAST:  132mL OMNIPAQUE IOHEXOL 350 MG/ML SOLN COMPARISON:  Chest radiograph dated 05/20/2019 FINDINGS: Cardiovascular: There is no cardiomegaly or pericardial effusion. The thoracic aorta is unremarkable. Evaluation of the pulmonary arteries is somewhat limited due to suboptimal opacification and respiratory motion artifact. No large or central pulmonary artery embolus identified. Mediastinum/Nodes: No hilar or  mediastinal adenopathy. The esophagus and the thyroid gland are grossly unremarkable. No mediastinal fluid collection. Lungs/Pleura: Confluent bilateral predominantly peripheral and subpleural airspace opacities most consistent with multifocal pneumonia. There are a spectrum of findings in the lungs which can be seen with acute atypical infection (as well as other non-infectious etiologies). In particular, viral pneumonia (including COVID-19) should be considered in the appropriate clinical setting. There is no pleural effusion pneumothorax. The central airways are patent. Upper Abdomen: No acute abnormality. Musculoskeletal: No chest wall abnormality. No acute or significant osseous findings. Review  of the MIP images confirms the above findings. IMPRESSION: 1. No CT evidence of central pulmonary artery embolus. 2. Bilateral predominantly peripheral and subpleural airspace opacities most consistent with multifocal pneumonia. Clinical correlation and follow-up to resolution recommended. Electronically Signed   By: Elgie Collard M.D.   On: 05/21/2019 00:16   Dg Chest Port 1 View  Result Date: 05/25/2019 CLINICAL DATA:  Pneumonia EXAM: PORTABLE CHEST 1 VIEW COMPARISON:  05/23/2019 FINDINGS: Stable cardiomediastinal contours. Patchy bilateral airspace opacities predominantly at the lung bases and within the periphery of the left mid lung. Findings are similar to slightly progressed compared to prior. No large pleural fluid collection. No pneumothorax. IMPRESSION: Patchy bilateral airspace opacities, similar to slightly progressed compared to prior. Electronically Signed   By: Duanne Guess M.D.   On: 05/25/2019 08:58   Dg Chest Port 1 View  Result Date: 05/23/2019 CLINICAL DATA:  Pneumonia due to COVID-19 EXAM: PORTABLE CHEST 1 VIEW COMPARISON:  Three days ago FINDINGS: Cardiomegaly. Patchy bilateral pulmonary opacity, especially at the lung bases, with mild increased from before. Borderline heart size. No effusion or pneumothorax. IMPRESSION: Bilateral pneumonia with progression from 3 days ago. Electronically Signed   By: Marnee Spring M.D.   On: 05/23/2019 04:41   Dg Chest Portable 1 View  Result Date: 05/20/2019 CLINICAL DATA:  Acute shortness of breath and chest pain. COVID-19 positive. EXAM: PORTABLE CHEST 1 VIEW COMPARISON:  10/11/2018 chest radiograph FINDINGS: The cardiomediastinal silhouette is unremarkable. Bilateral LOWER lung airspace opacities are noted, LEFT-greater-than-RIGHT. No definite pleural effusion. No pneumothorax or acute bony abnormality. IMPRESSION: Bilateral LOWER lung airspace opacities, LEFT-greater-than-RIGHT, likely representing pneumonia/infection. Electronically  Signed   By: Harmon Pier M.D.   On: 05/20/2019 21:09     Subjective: Says he would decline plasma because he feels so well. Denies shortness of breath. Walked to his wife's room without oxygen. No chest pain or other complaints.   Discharge Exam: Vitals:   05/25/19 0500 05/25/19 0800  BP: 129/86 133/78  Pulse:  82  Resp: 20 18  Temp: 98.6 F (37 C) 98.5 F (36.9 C)  SpO2:  90%   General: Pt is alert, awake, not in acute distress Cardiovascular: RRR, S1/S2 +, no rubs, no gallops Respiratory: CTA bilaterally, no wheezing, no rhonchi Abdominal: Soft, NT, ND, bowel sounds + Extremities: No edema, no cyanosis  Labs: BNP (last 3 results) No results for input(s): BNP in the last 8760 hours. Basic Metabolic Panel: Recent Labs  Lab 05/20/19 2019 05/21/19 0045 05/21/19 0119 05/22/19 0316 05/23/19 0417 05/24/19 0335 05/25/19 0250  NA 132*  --   --  134* 137 136 135  K 3.0*  --   --  3.0* 3.9 3.6 4.2  CL 96*  --   --  100 102 102 103  CO2 24  --   --  26 25 26 24   GLUCOSE 130*  --   --  118* 114* 112* 114*  BUN 13  --   --  14 16 14 14   CREATININE 1.96*  --  1.51* 1.21 1.17 1.02 1.00  CALCIUM 8.7*  --   --  7.9* 8.1* 8.2* 8.5*  MG  --  1.9  --   --   --   --   --    Liver Function Tests: Recent Labs  Lab 05/20/19 2313 05/22/19 0316 05/23/19 0417 05/24/19 0335 05/25/19 0250  AST 29 31 26 22 20   ALT 19 24 21 19 21   ALKPHOS 43 42 43 41 42  BILITOT 0.9 0.8 0.4 0.6 0.6  PROT 7.2 7.4 7.7 7.3 7.1  ALBUMIN 3.3* 3.4* 3.3* 3.1* 3.2*   No results for input(s): LIPASE, AMYLASE in the last 168 hours. No results for input(s): AMMONIA in the last 168 hours. CBC: Recent Labs  Lab 05/21/19 0119 05/22/19 0316 05/23/19 0417 05/24/19 0335 05/25/19 0250  WBC 4.0 4.9 4.4 4.2 5.8  HGB 13.9 13.1 13.5 13.4 13.7  HCT 41.4 39.3 40.5 40.9 40.7  MCV 87.9 86.2 88.2 87.6 87.0  PLT 152 174 197 238 277   Cardiac Enzymes: No results for input(s): CKTOTAL, CKMB, CKMBINDEX, TROPONINI  in the last 168 hours. BNP: Invalid input(s): POCBNP CBG: No results for input(s): GLUCAP in the last 168 hours. D-Dimer Recent Labs    05/24/19 0335 05/25/19 0250  DDIMER 0.29 0.31   Hgb A1c Recent Labs    05/23/19 0417  HGBA1C 6.1*   Lipid Profile No results for input(s): CHOL, HDL, LDLCALC, TRIG, CHOLHDL, LDLDIRECT in the last 72 hours. Thyroid function studies No results for input(s): TSH, T4TOTAL, T3FREE, THYROIDAB in the last 72 hours.  Invalid input(s): FREET3 Anemia work up Recent Labs    05/24/19 0335 05/25/19 0250  FERRITIN 380* 386*   Urinalysis No results found for: COLORURINE, APPEARANCEUR, LABSPEC, PHURINE, GLUCOSEU, HGBUR, BILIRUBINUR, KETONESUR, PROTEINUR, UROBILINOGEN, NITRITE, LEUKOCYTESUR  Microbiology Recent Results (from the past 240 hour(s))  SARS Coronavirus 2 [facility] order, Performed in [facility] hospital lab) Nasopharyngeal Nasopharyngeal Swab     Status: Abnormal   Collection Time: 05/21/19 12:19 AM   Specimen: Nasopharyngeal Swab  Result Value Ref Range Status   SARS Coronavirus 2 POSITIVE (A) NEGATIVE Final    Comment: RESULT CALLED TO, READ BACK BY AND VERIFIED WITH: Santiago Bumpers, RN AT 0411 ON 05/21/2019 BY SAINVILUS S (NOTE) If result is NEGATIVE SARS-CoV-2 target nucleic acids are NOT DETECTED. The SARS-CoV-2 RNA is generally detectable in upper and lower  respiratory specimens during the acute phase of infection. The lowest  concentration of SARS-CoV-2 viral copies this assay can detect is 250  copies / mL. A negative result does not preclude SARS-CoV-2 infection  and should not be used as the sole basis for treatment or other  patient management decisions.  A negative result may occur with  improper specimen collection / handling, submission of specimen other  than nasopharyngeal swab, presence of viral mutation(s) within the  areas targeted by this assay, and inadequate number of viral copies  (<250 copies / mL). A negative  result must be combined with clinical  observations, patient history, and epidemiological information. If result is POSITIVE SARS-CoV-2 target nucleic acids ar e DETECTED. The SARS-CoV-2 RNA is generally detectable in upper and lower  respiratory specimens during the acute phase of infection.  Positive  results are indicative of active infection with SARS-CoV-2.  Clinical  correlation with patient history and other diagnostic information is  necessary to determine patient infection  status.  Positive results do  not rule out bacterial infection or co-infection with other viruses. If result is PRESUMPTIVE POSTIVE SARS-CoV-2 nucleic acids MAY BE PRESENT.   A presumptive positive result was obtained on the submitted specimen  and confirmed on repeat testing.  While 2019 novel coronavirus  (SARS-CoV-2) nucleic acids may be present in the submitted sample  additional confirmatory testing may be necessary for epidemiological  and / or clinical management purposes  to differentiate between  SARS-CoV-2 and other Sarbecovirus currently known to infect humans.  If clinically indicated additional testing with an alternate test  methodolog y [phone]([id number]) is advised. The SARS-CoV-2 RNA is generally  detectable in upper and lower respiratory specimens during the acute  phase of infection. The expected result is Negative. Fact Sheet for Patients:  BoilerBrush.com.cyhttps://www.fda.gov/media/136312/download Fact Sheet for Healthcare Providers: https://pope.com/https://www.fda.gov/media/136313/download This test is not yet approved or cleared by the [place] FDA and has been authorized for detection and/or diagnosis of SARS-CoV-2 by FDA under an Emergency Use Authorization (EUA).  This EUA will remain in effect (meaning this test can be used) for the duration of the COVID-19 declaration under Section 564(b)(1) of the Act, 21 U.S.C. section 360bbb-3(b)(1), unless the authorization is terminated or revoked sooner. Performed at [facility], [address] [place], [place] [address]   Blood Culture (routine x 2)     Status: None (Preliminary result)   Collection Time: 05/21/19  1:16 AM   Specimen: BLOOD  Result Value Ref Range Status   Specimen Description BLOOD LEFT HAND  Final   Special Requests   Final    BOTTLES DRAWN AEROBIC AND ANAEROBIC Blood Culture adequate volume   Culture   Final    NO GROWTH 3 DAYS Performed at [facility], [address] [place], [place] [address]    Report Status PENDING  Incomplete    Time coordinating discharge: Approximately 40 minutes  Tyrone Nineyan B Chiara Coltrin, MD  [facility] 05/25/2019, 9:52 AM

## 2019-05-25 NOTE — Progress Notes (Signed)
Patient will update family member

## 2019-05-25 NOTE — Progress Notes (Signed)
Ambulated approximately 200 ft, on room air, maintained sats > 92%

## 2019-05-25 NOTE — Progress Notes (Signed)
Patient discharged to home, AVS reviewed, prescription was sent to patient's pharmacy, IV removed, public health contract signed and placed in chart. Patients family provided transportation.

## 2019-05-25 NOTE — TOC Transition Note (Signed)
Transition of Care [facility]) - CM/SW Discharge Note   Patient Details  Name: Troy Calderon MRN: [id number] Date of Birth: [birth year]  [facility] CM/SW Contact:  Ninfa Meeker, RN Phone Number: 05/25/2019, 12:25 PM   Clinical Narrative:   48 yr old gentleman admitted for treatment of COVID 19. Thankfully patient is recovering and will discharge home. No discharge needs identified.          Patient Goals and CMS Choice        Discharge Placement                       Discharge Plan and Services                                     Social Determinants of Health (SDOH) Interventions     Readmission Risk Interventions No flowsheet data found.

## 2019-05-26 LAB — CULTURE, BLOOD (ROUTINE X 2)
Culture: NO GROWTH
Special Requests: ADEQUATE

## 2019-06-16 ENCOUNTER — Other Ambulatory Visit: Payer: Self-pay | Admitting: Family Medicine

## 2019-06-16 ENCOUNTER — Ambulatory Visit
Admission: RE | Admit: 2019-06-16 | Discharge: 2019-06-16 | Disposition: A | Discharge status: Home / Self Care | Payer: 59 | Source: Ambulatory Visit | Attending: Family Medicine | Admitting: Family Medicine

## 2019-06-16 DIAGNOSIS — J1289 Other viral pneumonia: Secondary | ICD-10-CM

## 2020-02-01 IMAGING — CT CT ANGIO CHEST
2 of 6 series · 19 of 46 positions shown · IV contrast (omnipaque)
Comparison: Chest radiograph dated 05/20/2019

CLINICAL DATA: 40-year-old male with concern for pulmonary
embolism. CG4L6-G7.

EXAM:
CT ANGIOGRAPHY CHEST WITH CONTRAST
TECHNIQUE: Multidetector CT imaging of the chest was performed using the
standard protocol during bolus administration of intravenous
contrast. Multiplanar CT image reconstructions and MIPs were
obtained to evaluate the vascular anatomy.
CONTRAST:  100mL OMNIPAQUE IOHEXOL 350 MG/ML SOLN

[Series 6: thins · axial · 0.95mm/px · z∈[-167,+127]mm · 16 of 324 slices shown]
[im 15/324  lung]
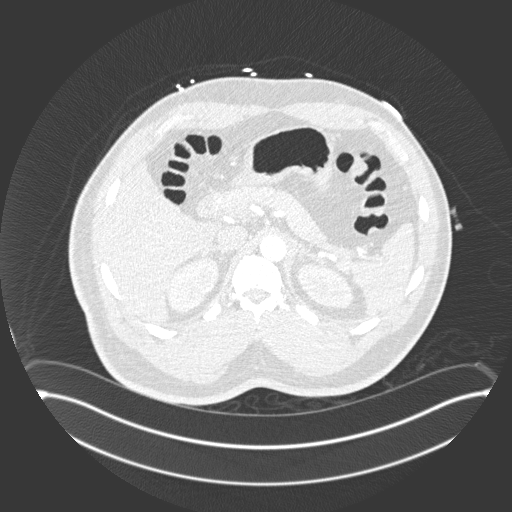
[im 43/324  soft-tissue]
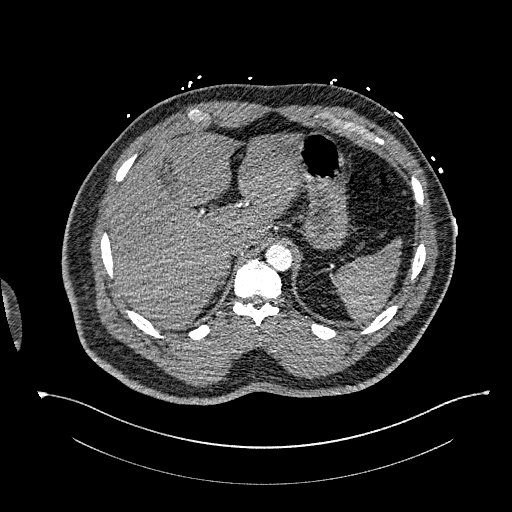
[im 57/324  lung]
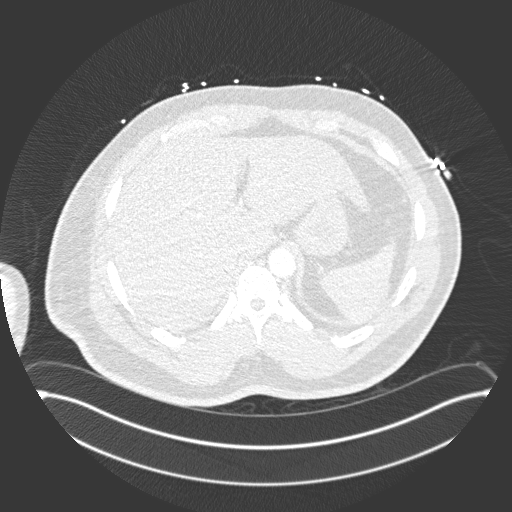
[im 71/324  soft-tissue]
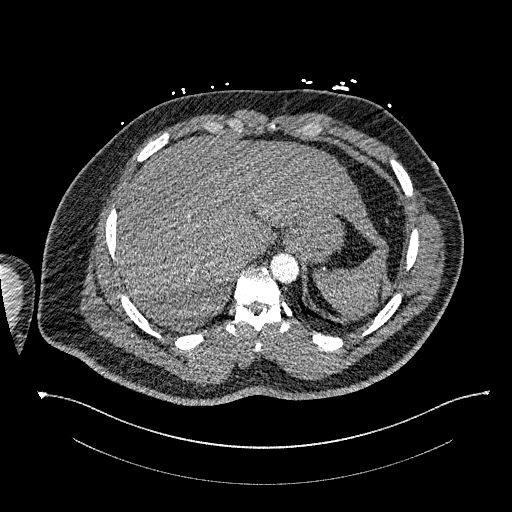
[im 99/324  lung]
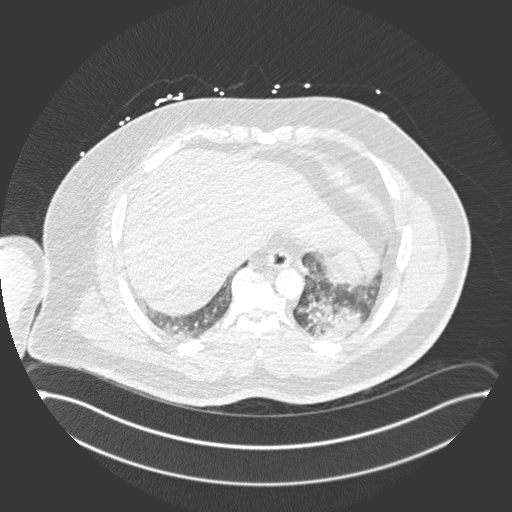
[im 113/324  soft-tissue]
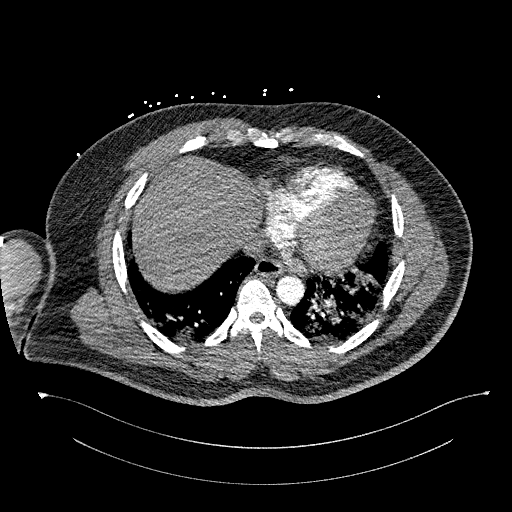
[im 127/324  lung]
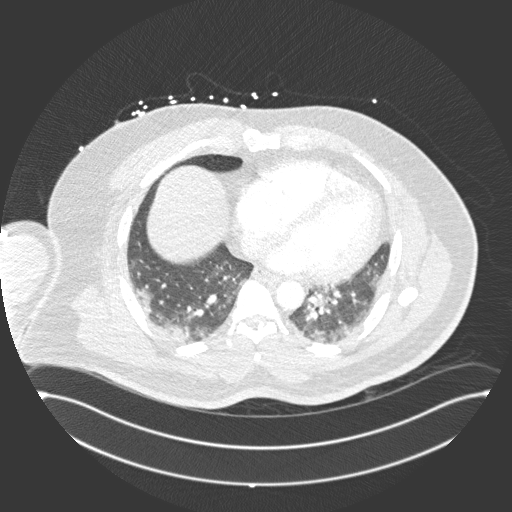
[im 155/324  soft-tissue]
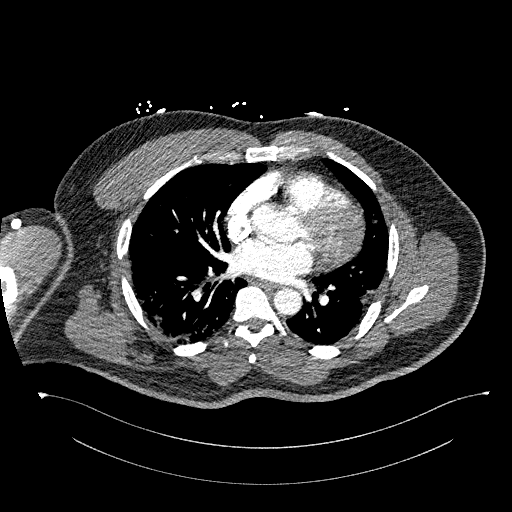
[im 169/324  lung]
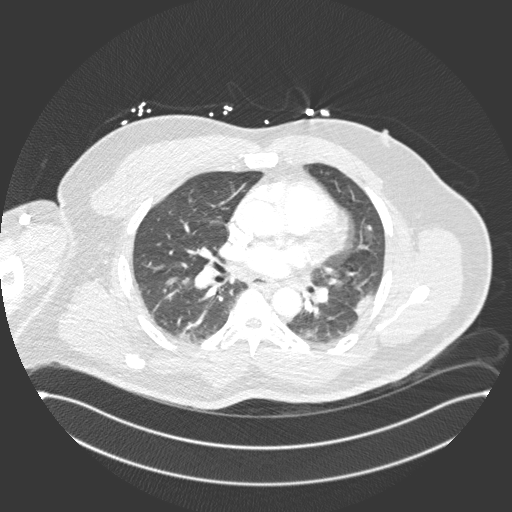
[im 197/324  soft-tissue]
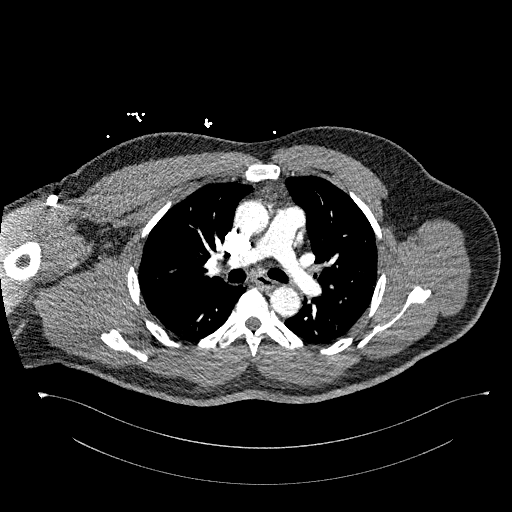
[im 211/324  lung]
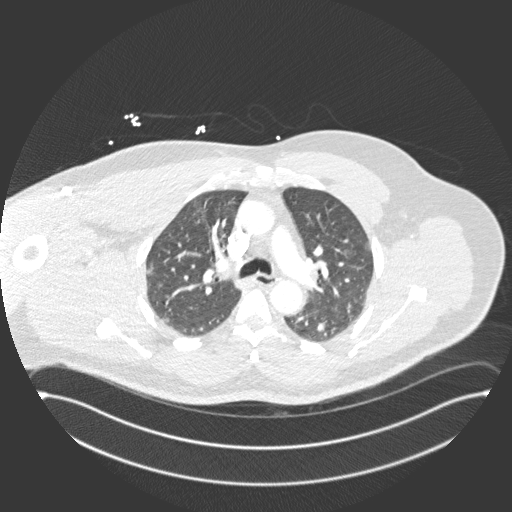
[im 225/324  soft-tissue]
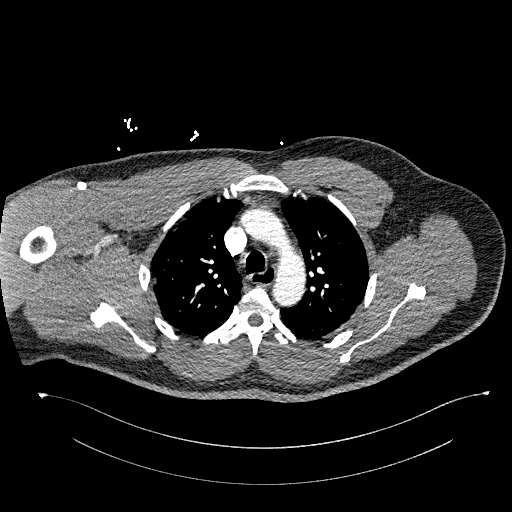
[im 253/324  lung]
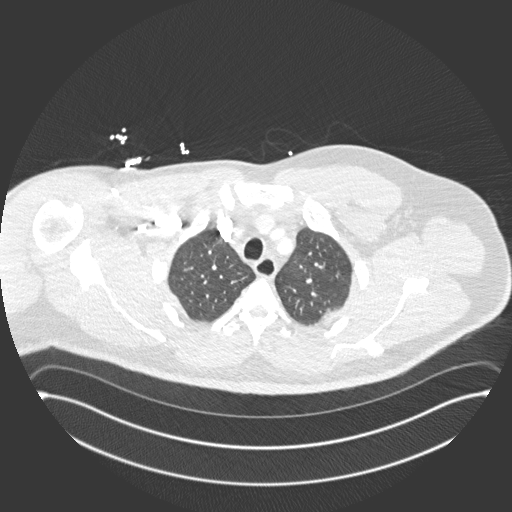
[im 267/324  soft-tissue]
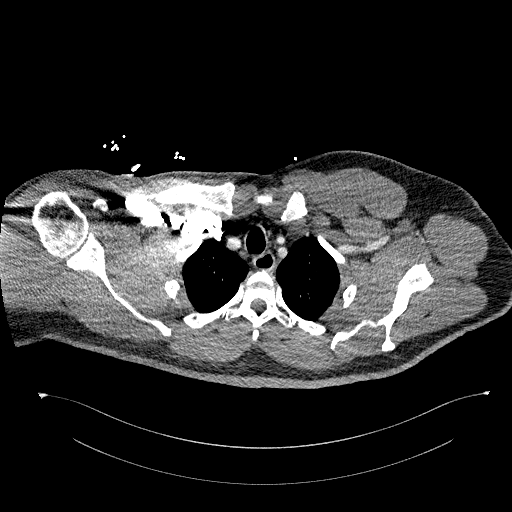
[im 281/324  lung]
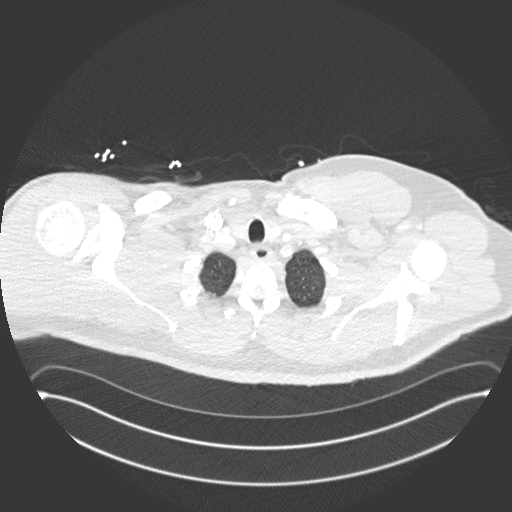
[im 309/324  soft-tissue]
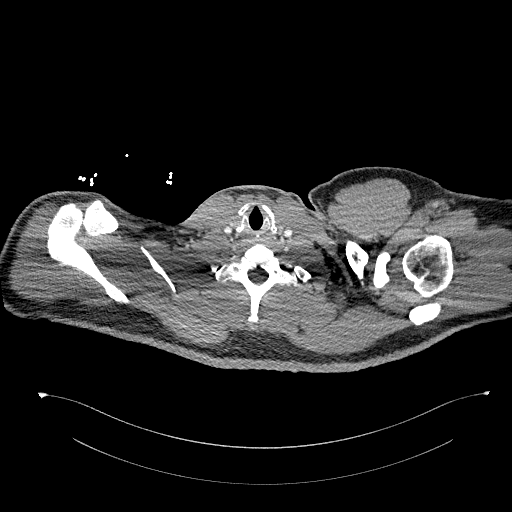

[Series 8: coronal mpr · coronal · 0.63mm/px · 3 of 151 slices shown]
[im 38/151  soft-tissue]
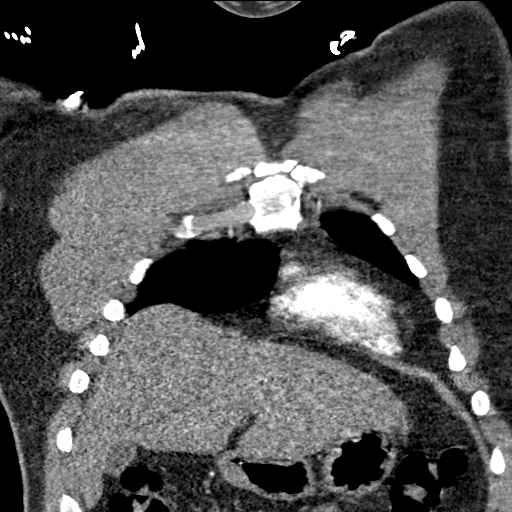
[im 76/151  soft-tissue]
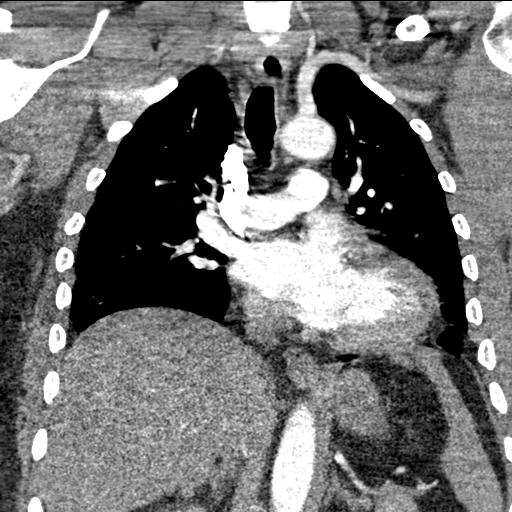
[im 113/151  soft-tissue]
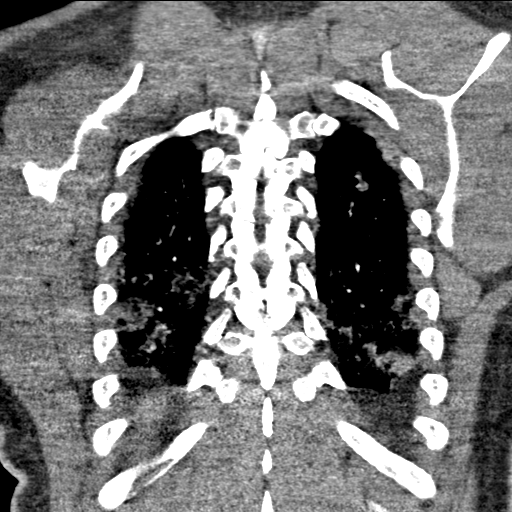

[19 of 46 positions shown; findings below may reference images not displayed]

FINDINGS: Cardiovascular: There is no cardiomegaly or pericardial effusion.
The thoracic aorta is unremarkable. Evaluation of the pulmonary
arteries is somewhat limited due to suboptimal opacification and
respiratory motion artifact. No large or central pulmonary artery
embolus identified.

Mediastinum/Nodes: No hilar or mediastinal adenopathy. The esophagus
and the thyroid gland are grossly unremarkable. No mediastinal fluid
collection.

Lungs/Pleura: Confluent bilateral predominantly peripheral and
subpleural airspace opacities most consistent with multifocal
pneumonia. There are a spectrum of findings in the lungs which can
be seen with acute atypical infection (as well as other
non-infectious etiologies). In particular, viral pneumonia
(including CG4L6-G7) should be considered in the appropriate
clinical setting. There is no pleural effusion pneumothorax. The
central airways are patent.

Upper Abdomen: No acute abnormality.

Musculoskeletal: No chest wall abnormality. No acute or significant
osseous findings.

Review of the MIP images confirms the above findings.
IMPRESSION: 1. No CT evidence of central pulmonary artery embolus.
2. Bilateral predominantly peripheral and subpleural airspace
opacities most consistent with multifocal pneumonia. Clinical
correlation and follow-up to resolution recommended.

## 2022-09-18 ENCOUNTER — Other Ambulatory Visit: Payer: Self-pay

## 2022-09-18 ENCOUNTER — Emergency Department (HOSPITAL_COMMUNITY)
Admission: EM | Admit: 2022-09-18 | Discharge: 2022-09-18 | Disposition: A | Discharge status: Home / Self Care | Payer: 59 | Attending: Emergency Medicine | Admitting: Emergency Medicine

## 2022-09-18 ENCOUNTER — Encounter (HOSPITAL_COMMUNITY): Payer: Self-pay

## 2022-09-18 ENCOUNTER — Emergency Department (HOSPITAL_COMMUNITY): Payer: 59

## 2022-09-18 DIAGNOSIS — R000 Tachycardia, unspecified: Secondary | ICD-10-CM | POA: Diagnosis not present

## 2022-09-18 DIAGNOSIS — Z79899 Other long term (current) drug therapy: Secondary | ICD-10-CM | POA: Insufficient documentation

## 2022-09-18 DIAGNOSIS — N390 Urinary tract infection, site not specified: Secondary | ICD-10-CM | POA: Diagnosis not present

## 2022-09-18 DIAGNOSIS — I10 Essential (primary) hypertension: Secondary | ICD-10-CM | POA: Insufficient documentation

## 2022-09-18 DIAGNOSIS — B9689 Other specified bacterial agents as the cause of diseases classified elsewhere: Secondary | ICD-10-CM | POA: Insufficient documentation

## 2022-09-18 DIAGNOSIS — N410 Acute prostatitis: Secondary | ICD-10-CM | POA: Diagnosis not present

## 2022-09-18 DIAGNOSIS — R109 Unspecified abdominal pain: Secondary | ICD-10-CM | POA: Diagnosis present

## 2022-09-18 LAB — CBC WITH DIFFERENTIAL/PLATELET
Abs Immature Granulocytes: 0.11 10*3/uL — ABNORMAL HIGH (ref 0.00–0.07)
Basophils Absolute: 0 10*3/uL (ref 0.0–0.1)
Basophils Relative: 0 %
Eosinophils Absolute: 0 10*3/uL (ref 0.0–0.5)
Eosinophils Relative: 0 %
HCT: 43.4 % (ref 39.0–52.0)
Hemoglobin: 14.6 g/dL (ref 13.0–17.0)
Immature Granulocytes: 1 %
Lymphocytes Relative: 7 %
Lymphs Abs: 0.9 10*3/uL (ref 0.7–4.0)
MCH: 29.9 pg (ref 26.0–34.0)
MCHC: 33.6 g/dL (ref 30.0–36.0)
MCV: 88.9 fL (ref 80.0–100.0)
Monocytes Absolute: 1.3 10*3/uL — ABNORMAL HIGH (ref 0.1–1.0)
Monocytes Relative: 10 %
Neutro Abs: 10.6 10*3/uL — ABNORMAL HIGH (ref 1.7–7.7)
Neutrophils Relative %: 82 %
Platelets: 178 10*3/uL (ref 150–400)
RBC: 4.88 MIL/uL (ref 4.22–5.81)
RDW: 14.6 % (ref 11.5–15.5)
WBC: 13 10*3/uL — ABNORMAL HIGH (ref 4.0–10.5)
nRBC: 0 % (ref 0.0–0.2)

## 2022-09-18 LAB — COMPREHENSIVE METABOLIC PANEL
ALT: 28 U/L (ref 0–44)
AST: 23 U/L (ref 15–41)
Albumin: 3.8 g/dL (ref 3.5–5.0)
Alkaline Phosphatase: 46 U/L (ref 38–126)
Anion gap: 9 (ref 5–15)
BUN: 15 mg/dL (ref 6–20)
CO2: 23 mmol/L (ref 22–32)
Calcium: 8.5 mg/dL — ABNORMAL LOW (ref 8.9–10.3)
Chloride: 103 mmol/L (ref 98–111)
Creatinine, Ser: 1.54 mg/dL — ABNORMAL HIGH (ref 0.61–1.24)
GFR, Estimated: 54 mL/min — ABNORMAL LOW (ref >60)
Glucose, Bld: 140 mg/dL — ABNORMAL HIGH (ref 70–99)
Potassium: 3.3 mmol/L — ABNORMAL LOW (ref 3.5–5.1)
Sodium: 135 mmol/L (ref 135–145)
Total Bilirubin: 1.1 mg/dL (ref 0.3–1.2)
Total Protein: 7.7 g/dL (ref 6.5–8.1)

## 2022-09-18 LAB — URINALYSIS, ROUTINE W REFLEX MICROSCOPIC
Bilirubin Urine: NEGATIVE
Glucose, UA: NEGATIVE mg/dL
Ketones, ur: 5 mg/dL — AB
Nitrite: NEGATIVE
Protein, ur: NEGATIVE mg/dL
Specific Gravity, Urine: 1.015 (ref 1.005–1.030)
WBC, UA: >50 WBC/hpf — ABNORMAL HIGH (ref 0–5)
pH: 5 (ref 5.0–8.0)

## 2022-09-18 LAB — LACTIC ACID, PLASMA: Lactic Acid, Venous: 1.5 mmol/L (ref 0.5–1.9)

## 2022-09-18 MED ORDER — CIPROFLOXACIN HCL 500 MG PO TABS: 500.0000 mg | ORAL_TABLET | Freq: Two times a day (BID) | ORAL | 0 refills | Status: DC

## 2022-09-18 MED ORDER — SODIUM CHLORIDE 0.9 % IV BOLUS
1000.0000 mL | Freq: Once | INTRAVENOUS | Status: AC
  Administered 2022-09-18: 1000 mL via INTRAVENOUS

## 2022-09-18 MED ORDER — SODIUM CHLORIDE 0.9 % IV SOLN
1.0000 g | Freq: Once | INTRAVENOUS | Status: AC
  Administered 2022-09-18: 1 g via INTRAVENOUS
  Filled 2022-09-18: qty 10

## 2022-09-18 MED ORDER — ACETAMINOPHEN 500 MG PO TABS
1000.0000 mg | ORAL_TABLET | Freq: Once | ORAL | Status: AC
  Administered 2022-09-18: 1000 mg via ORAL
  Filled 2022-09-18: qty 2

## 2022-09-18 NOTE — ED Provider Notes (Signed)
[facility] DEPT Provider Note   CSN: [id number] Arrival date & time: 09/18/22  3244     History  Chief Complaint  Patient presents with   Chills   Fever   Urinary Retention    Troy Calderon is a 52 y.o. male.  The history is provided by the patient and medical records. No language interpreter was used.  Fever    52 year old male with significant history of hypertension presenting with urinary complaint.  Patient reports since yesterday he has had urinary discomfort which includes urgency and frequency with minimal urine production.  Furthermore, he also endorsed having some fever and chills.  Does not endorse any significant flank pain or abdominal pain no nausea vomiting diarrhea no penile discharge.  Tries taking NyQuil with minimal relief.  Does not endorse any runny nose sneezing coughing sore throat.  Home Medications Prior to Admission medications   Medication Sig Start Date End Date Taking? Authorizing Provider  acetaminophen (TYLENOL) 500 MG tablet Take 1,000 mg by mouth every 6 (six) hours as needed for mild pain or headache.    [provider]  hydrochlorothiazide (MICROZIDE) 12.5 MG capsule Take 12.5 mg by mouth daily.    [provider]  Multiple Vitamin (MULTI-DAY PO) Take 1 tablet by mouth daily. Vit C,  Elderberry    [provider]  olmesartan (BENICAR) 20 MG tablet Take 20 mg by mouth daily.    [provider]      Allergies    Patient has no known allergies.    Review of Systems   Review of Systems  Constitutional:  Positive for fever.  All other systems reviewed and are negative.   Physical Exam Updated Vital Signs BP (!) 141/81   Pulse (!) 127   Temp 99.9 F (37.7 C) (Oral)   Resp 20   Ht 6\' 1"  (1.854 m)   Wt 132 kg   SpO2 94%   BMI 38.39 kg/m  Physical Exam Vitals and nursing note reviewed.  Constitutional:      General: He is not in acute distress.    Appearance: He is  well-developed.  HENT:     Head: Atraumatic.  Eyes:     Conjunctiva/sclera: Conjunctivae normal.  Cardiovascular:     Rate and Rhythm: Tachycardia present.     Pulses: Normal pulses.     Heart sounds: Normal heart sounds.  Pulmonary:     Effort: Pulmonary effort is normal.     Breath sounds: Normal breath sounds. No wheezing, rhonchi or rales.  Abdominal:     Palpations: Abdomen is soft.     Tenderness: There is no abdominal tenderness. There is no right CVA tenderness or left CVA tenderness.  Musculoskeletal:     Cervical back: Neck supple.  Skin:    Findings: No rash.  Neurological:     Mental Status: He is alert.     ED Results / Procedures / Treatments   Labs (all labs ordered are listed, but only abnormal results are displayed) Labs Reviewed  URINALYSIS, ROUTINE W REFLEX MICROSCOPIC - Abnormal; Notable for the following components:      Result Value   APPearance HAZY (*)    Hgb urine dipstick MODERATE (*)    Ketones, ur 5 (*)    Leukocytes,Ua LARGE (*)    WBC, UA >50 (*)    Bacteria, UA FEW (*)    All other components within normal limits  CBC WITH DIFFERENTIAL/PLATELET - Abnormal; Notable for the  following components:   WBC 13.0 (*)    Neutro Abs 10.6 (*)    Monocytes Absolute 1.3 (*)    Abs Immature Granulocytes 0.11 (*)    All other components within normal limits  COMPREHENSIVE METABOLIC PANEL - Abnormal; Notable for the following components:   Potassium 3.3 (*)    Glucose, Bld 140 (*)    Creatinine, Ser 1.54 (*)    Calcium 8.5 (*)    GFR, Estimated 54 (*)    All other components within normal limits  URINE CULTURE  LACTIC ACID, PLASMA  LACTIC ACID, PLASMA    EKG None  Radiology CT Renal Stone Study  Result Date: 09/18/2022 CLINICAL DATA:  Abdominal pain, flank pain EXAM: CT ABDOMEN AND PELVIS WITHOUT CONTRAST TECHNIQUE: Multidetector CT imaging of the abdomen and pelvis was performed following the standard protocol without IV contrast. RADIATION  DOSE REDUCTION: This exam was performed according to the departmental dose-optimization program which includes automated exposure control, adjustment of the mA and/or kV according to patient size and/or use of iterative reconstruction technique. COMPARISON:  CT abdomen done on 04/01/2017 FINDINGS: Lower chest: Slight prominence of interstitial markings in the lower lung fields may suggest scarring or interstitial pneumonia. Hepatobiliary: No focal abnormalities are seen in liver. There is no dilation of bile ducts. Gallbladder is not distended. Pancreas: No focal abnormalities are seen. Spleen: Unremarkable. Adrenals/Urinary Tract: Adrenals are unremarkable. There is no hydronephrosis. There are no renal or ureteral stones. Urinary bladder is not distended. Mild diffuse wall thickening is seen in the bladder. Stomach/Bowel: Stomach is unremarkable. Small bowel loops are not dilated. Appendix is unremarkable. Scattered diverticula are seen in colon without signs of focal diverticulitis. Vascular/Lymphatic: Unremarkable. Reproductive: Prostate is enlarged. There is stranding in the fat planes adjacent to prostate and seminal vesicles. No loculated fluid collections are seen. Other: There is no ascites or pneumoperitoneum. Small umbilical hernia containing fat is seen. Small bilateral inguinal hernias containing fat are noted. Musculoskeletal: There is laminectomy and fusion at L5-S1 level. There is minimal retrolisthesis at L1-L2 and L4-L5 levels. Degenerative changes are noted with bony spurs, more so at L3-L4 level. IMPRESSION: There is no evidence of intestinal obstruction or pneumoperitoneum. Appendix is not dilated. There is no hydronephrosis. Mild diffuse wall thickening in the bladder may be due to incomplete distention or cystitis. Prostate is enlarged. There is stranding in the fat planes adjacent to prostate and seminal vesicles suggesting possible acute prostatitis. There are no loculated fluid collections  in or around prostate. Scattered diverticula are seen in colon without signs of focal acute diverticulitis. Slight prominence of interstitial markings in the lower lung fields may suggest scarring. Less likely possibility would be early interstitial pneumonia. Other findings as described in the body of the report. Electronically Signed   By: Elmer Picker M.D.   On: 09/18/2022 08:57    Procedures Procedures    Medications Ordered in ED Medications  sodium chloride 0.9 % bolus 1,000 mL (0 mLs Intravenous Stopped 09/18/22 1007)  acetaminophen (TYLENOL) tablet 1,000 mg (1,000 mg Oral Given 09/18/22 0752)  cefTRIAXone (ROCEPHIN) 1 g in sodium chloride 0.9 % 100 mL IVPB (0 g Intravenous Stopped 09/18/22 1007)    ED Course/ Medical Decision Making/ A&P                           Medical Decision Making Amount and/or Complexity of Data Reviewed Labs: ordered. Radiology: ordered.  Risk OTC drugs.  BP (!) 142/97   Pulse (!) 116   Temp 99.9 F (37.7 C) (Oral)   Resp (!) 22   Ht 6\' 1"  (1.854 m)   Wt 132 kg   SpO2 95%   BMI 38.39 kg/m   69:19 AM 52 year old male with significant history of hypertension presenting with urinary complaint.  Patient reports since yesterday he has had urinary discomfort which includes urgency and frequency with minimal urine production.  Furthermore, he also endorsed having some fever and chills.  Does not endorse any significant flank pain or abdominal pain no nausea vomiting diarrhea no penile discharge.  Tries taking NyQuil with minimal relief.  Does not endorse any runny nose sneezing coughing sore throat.  On exam, patient is resting comfortably appears to be in no acute discomfort.  He is mentating appropriately.  Heart with tachycardia but no murmur rubs or gallops, lungs is clear to auscultation, skin is warm to the touch, abdomen soft nontender, no significant CVA tenderness noted.  Vitals are reviewed and remarkable for an oral temperature of  99.9.  Blood pressure is 142/97.  Heart rate is elevated at 116.  Respiratory rate is elevated at 22.  O2 sat is normal at 95%.  A bladder scan was obtained showing 100 mL of urine.  Labs imaging obtained independently viewed interpreted by me and I agree with radiologist the patient.  Labs remarkable for a white count of 13.0, will check lactic acid however I am concerned that patient is likely septic we will give IV fluid, and I also noted the patient has signs of AKI with creatinine 1.54.  IV fluids should help.  Urinalysis obtained shows moderate hemoglobin and urine dipsticks with large leukocyte esterase and greater than 50 WBCs suggestive of urinary tract infection.  I have initiated antibiotic which includes Rocephin, urine culture sent.  9:36 AM CT scan of the abdomen pelvis independently viewed interpreted by me and agree with radiologist interpretation.  CT scan shows mild diffuse wall thickening of the bladder which may be due to incomplete distention or cystitis.  Patient's prostate is enlarged with stranding suggestive of possible acute prostatitis.  No obvious signs of prostate abscess.  I performed a digital rectal exam with chaperone present.  Prostate is palpated, smooth, and nontender on exam.  At this time, plan to discharge home with Cipro for 10 days and encouraged patient to follow-up with PCP for follow-up as well as for a PSA test.  Patient voiced understanding and agrees with plan.  -The patient was maintained on a cardiac monitor.  I personally viewed and interpreted the cardiac monitored which showed an underlying rhythm of: sinus tachycardia -Imaging independently viewed and interpreted by me and I agree with radiologist's interpretation.  Result remarkable for CT showing cystitis and enlarged prostate -This patient presents to the ED for concern of dysuria, this involves an extensive number of treatment options, and is a complaint that carries with it a high risk of  complications and morbidity.  The differential diagnosis includes UTI, pyelo, prostatitis, colitis, kidney stone -Co morbidities that complicate the patient evaluation includes HTN -Treatment includes sepsis protocol with abx, IVF -Reevaluation of the patient after these medicines showed that the patient improved -PCP office notes or outside notes reviewed -Escalation to admission/observation considered: patients feels much better, is comfortable with discharge, and will follow up with PCP -Prescription medication considered, patient comfortable with cipro x 10 days -Social Determinant of Health considered  11:23 AM Fortunately lactic acid is normal.  Patient does not have any rectal discomfort to suggest overt prostatitis.  Will treat for UTI with Cipro X 10 days.  Outpatient follow-up with PCP recommended.  Return precaution given.  I have low suspicion for STI causing his symptoms.  Sepsis workup canceled.         Final Clinical Impression(s) / ED Diagnoses Final diagnoses:  Lower urinary tract infectious disease  Acute prostatitis    Rx / DC Orders ED Discharge Orders          Ordered    ciprofloxacin (CIPRO) 500 MG tablet  2 times daily        09/18/22 1124              Fayrene Helper, PA-C 09/18/22 1126    Bethann Berkshire, MD 09/20/22 1020

## 2022-09-18 NOTE — ED Triage Notes (Signed)
Pt reports with chills, urinary retention, and fever since yesterday. Pt states that he dribbles but does not urinate fully.

## 2022-09-18 NOTE — Discharge Instructions (Addendum)
You have been evaluated for your symptoms.  Your infection is likely due to either prostatitis or urinary tract infection.  Take antibiotic as prescribed for the full duration.  Eat yogurt high in probiotic to decrease risk of antibiotic related diarrhea.  Follow-up closely with your primary care doctor and request for a PSA test to assess for your prostate health.  Take Tylenol as needed for aches and pain.  Return to the ER promptly if your symptoms worsen or if you have other concern.

## 2022-09-20 LAB — URINE CULTURE: Culture: >=100000 — AB

## 2022-09-21 ENCOUNTER — Telehealth (HOSPITAL_BASED_OUTPATIENT_CLINIC_OR_DEPARTMENT_OTHER): Payer: Self-pay | Admitting: *Deleted

## 2022-09-21 NOTE — Telephone Encounter (Signed)
Post ED Visit - Positive Culture Follow-up  Culture report reviewed by antimicrobial stewardship pharmacist: [facility] Team []  Elenor Quinones, Pharm.D. []  Heide Guile, Pharm.D., BCPS AQ-ID []  Parks Neptune, Pharm.D., BCPS []  Alycia Rossetti, Pharm.D., BCPS []  [place], Pharm.D., BCPS, AAHIVP []  Legrand Como, Pharm.D., BCPS, AAHIVP []  Salome Arnt, PharmD, BCPS []  Johnnette Gourd, PharmD, BCPS []  Hughes Better, PharmD, BCPS []  Leeroy Cha, PharmD []  Laqueta Linden, PharmD, BCPS []  Albertina Parr, PharmD  [facility] []  Leodis Sias, PharmD []  Lindell Spar, PharmD []  Royetta Asal, PharmD []  Graylin Shiver, Rph []  Rema Fendt) Glennon Mac, PharmD []  Arlyn Dunning, PharmD []  Netta Cedars, PharmD [x]  Dia Sitter, PharmD []  Leone Haven, PharmD []  Gretta Arab, PharmD []  Theodis Shove, PharmD []  Peggyann Juba, PharmD []  Reuel Boom, PharmD   Positive urine culture Treated with Ciprofloxacin, organism sensitive to the same and no further patient follow-up is required at this time.  Rosie Fate 09/21/2022, 9:12 AM

## 2022-10-15 NOTE — Patient Instructions (Signed)

## 2022-10-15 NOTE — Progress Notes (Signed)
 This encounter was created in error - please disregard.

## 2022-10-16 ENCOUNTER — Encounter: Payer: 59 | Admitting: Primary Care

## 2022-11-03 ENCOUNTER — Ambulatory Visit: Payer: 59 | Admitting: Primary Care

## 2022-11-03 ENCOUNTER — Encounter: Payer: Self-pay | Admitting: Primary Care

## 2022-11-03 VITALS — BP 126/84 | HR 88 | Temp 98.4°F | Ht 73.0 in | Wt 287.0 lb

## 2022-11-03 DIAGNOSIS — R0683 Snoring: Secondary | ICD-10-CM | POA: Diagnosis not present

## 2022-11-03 NOTE — Assessment & Plan Note (Addendum)
-   Patent has symptoms of loud snoring and witnessed apnea.  He gets on average 5 to 6 hours of sleep at night and feels well rested. No significant associated daytime sleepiness.  He does get tired with long drives. Epworth score 12.  BMI 37.  Concern patient has underlying obstructive sleep apnea, needs home sleep study to evaluate.  Reviewed risks of untreated sleep apnea including cardiac events, pulmonary hypertension, diabetes and stroke.  We also discussed treatment options including weight loss, oral appliance, CPAP therapy or referral to ENT for possible surgical options.  Encouraged weight loss efforts and side sleeping position.  Advised against driving if experiencing excessive daytime sleepiness fatigue.  Patient to follow-up 1 to 2 weeks after sleep study to review results and treatment options if needed.

## 2022-11-03 NOTE — Patient Instructions (Addendum)
Sleep apnea is defined as period of 10 seconds or longer when you stop breathing at night. This can happen multiple times a night. Dx sleep apnea is when this occurs more than 5 times an hour.    Mild OSA 5-15 apneic events an hour Moderate OSA 15-30 apneic events an hour Severe OSA > 30 apneic events an hour   Untreated sleep apnea puts you at higher risk for cardiac arrhythmias, pulmonary HTN, stroke and diabetes  Treatment options include weight loss, side sleeping position, oral appliance, CPAP therapy or referral to ENT for possible surgical options    Recommendations: Focus on side sleeping position or elevate head with wedge pillow 30 degrees Work on weight loss efforts if able  Do not drive if experiencing excessive daytime sleepiness of fatigue    Orders: Home sleep study re: loud snoring    Follow-up: Please call to schedule follow-up 1-2 weeks after completing home sleep study to review results and treatment if needed (can be virtual)  Sleep Apnea Sleep apnea affects breathing during sleep. It causes breathing to stop for 10 seconds or more, or to become shallow. People with sleep apnea usually snore loudly. It can also increase the risk of: Heart attack. Stroke. Being very overweight (obese). Diabetes. Heart failure. Irregular heartbeat. High blood pressure. The goal of treatment is to help you breathe normally again. What are the causes?  The most common cause of this condition is a collapsed or blocked airway. There are three kinds of sleep apnea: Obstructive sleep apnea. This is caused by a blocked or collapsed airway. Central sleep apnea. This happens when the brain does not send the right signals to the muscles that control breathing. Mixed sleep apnea. This is a combination of obstructive and central sleep apnea. What increases the risk? Being overweight. Smoking. Having a small airway. Being older. Being male. Drinking alcohol. Taking medicines to  calm yourself (sedatives or tranquilizers). Having family members with the condition. Having a tongue or tonsils that are larger than normal. What are the signs or symptoms? Trouble staying asleep. Loud snoring. Headaches in the morning. Waking up gasping. Dry mouth or sore throat in the morning. Being sleepy or tired during the day. If you are sleepy or tired during the day, you may also: Not be able to focus your mind (concentrate). Forget things. Get angry a lot and have mood swings. Feel sad (depressed). Have changes in your personality. Have less interest in sex, if you are male. Be unable to have an erection, if you are male. How is this treated?  Sleeping on your side. Using a medicine to get rid of mucus in your nose (decongestant). Avoiding the use of alcohol, medicines to help you relax, or certain pain medicines (narcotics). Losing weight, if needed. Changing your diet. Quitting smoking. Using a machine to open your airway while you sleep, such as: An oral appliance. This is a mouthpiece that shifts your lower jaw forward. A CPAP device. This device blows air through a mask when you breathe out (exhale). An EPAP device. This has valves that you put in each nostril. A BIPAP device. This device blows air through a mask when you breathe in (inhale) and breathe out. Having surgery if other treatments do not work. Follow these instructions at home: Lifestyle Make changes that your doctor recommends. Eat a healthy diet. Lose weight if needed. Avoid alcohol, medicines to help you relax, and some pain medicines. Do not smoke or use any products that contain  nicotine or tobacco. If you need help quitting, ask your doctor. General instructions Take over-the-counter and prescription medicines only as told by your doctor. If you were given a machine to use while you sleep, use it only as told by your doctor. If you are having surgery, make sure to tell your doctor you have  sleep apnea. You may need to bring your device with you. Keep all follow-up visits. Contact a doctor if: The machine that you were given to use during sleep bothers you or does not seem to be working. You do not get better. You get worse. Get help right away if: Your chest hurts. You have trouble breathing in enough air. You have an uncomfortable feeling in your back, arms, or stomach. You have trouble talking. One side of your body feels weak. A part of your face is hanging down. These symptoms may be an emergency. Get help right away. Call your local emergency services (911 in the [place] Do not wait to see if the symptoms will go away. Do not drive yourself to the hospital. Summary This condition affects breathing during sleep. The most common cause is a collapsed or blocked airway. The goal of treatment is to help you breathe normally while you sleep. This information is not intended to replace advice given to you by your health care provider. Make sure you discuss any questions you have with your health care provider. Document Revised: 04/03/2021 Document Reviewed: 08/03/2020 Elsevier Patient Education  [place].

## 2022-11-03 NOTE — Progress Notes (Signed)
Reviewed and agree with assessment/plan.   Chesley Mires, MD [facility] Pulmonary/Critical Care 11/03/2022, 12:08 PM Pager:  [phone]

## 2022-11-03 NOTE — Progress Notes (Signed)
$'[name]$  ID: Troy Calderon, male    DOB: Jan 31, 1971, 52 y.o.   MRN: [id number]  Chief Complaint  Patient presents with   Consult    Only able to sleep 5 hours each night.  Wife states he snores and gasping for air during night.    Referring provider: Armanda Heritage, NP  HPI: 52 year old male, never smoked. Past medical history significant for pneumonia due to COVID-19, urinary frequency, hypertension, elevated hemoglobin A1c, enlarged prostate, back surgery in 2000.  11/03/2022 Patient presents today for sleep consult. He has symptoms or snoring, witnessed apnea.  He gets on averge 5-6 hours of sleep a night no matter what times he seems to go to bed  He does feel rested when he wakes up  His wife tells him he snores. 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 He does not require naps during the day If he drives long distances he can get sleepy, he will pull over and let his wife drive  His weight is up 10-15 lbs in the last 2 years. Goal weight is 240lbs (260lb previously) Denies symptoms of narcolepsy, cataplexy or sleep walking   Sleep questionnaire Symptoms- snoring, witnessed apnea Prior sleep study- yes, inconclusive  Bedtime- 11pm-12am Time to fall asleep- 15 mins Nocturnal awakenings- 2-3 Out of bed/start of day- 6:30am Weight changes- up 10 lbs  Do you operate heavy machinery- no Do you currently wear CPAP- no Do you current wear oxygen- no Epworth- 12  Social hx: Married. Lives with his wife and children.  He works for the city.  Social alcohol use, 1 drink every 2 weeks.   No Known Allergies  Immunization History  Administered Date(s) Administered   Influenza-Unspecified 06/23/2022    Past Medical History:  Diagnosis Date    Hypertension     Tobacco History: Social History   Tobacco Use  Smoking Status Never  Smokeless Tobacco Never   Counseling given: Not Answered   Outpatient Medications Prior to Visit  Medication Sig Dispense Refill   acetaminophen (TYLENOL) 500 MG tablet Take 1,000 mg by mouth every 6 (six) hours as needed for mild pain or headache.     hydrochlorothiazide (MICROZIDE) 12.5 MG capsule Take 12.5 mg by mouth daily.     Multiple Vitamin (MULTI-DAY PO) Take 1 tablet by mouth daily. Vit C,  Elderberry     olmesartan (BENICAR) 20 MG tablet Take 20 mg by mouth daily.     ciprofloxacin (CIPRO) 500 MG tablet Take 1 tablet (500 mg total) by mouth 2 (two) times daily. One po bid x 7 days (Patient not taking: Reported on 11/03/2022) 20 tablet 0   No facility-administered medications prior to visit.   Review of Systems  Review of Systems  Constitutional:  Negative for fatigue.  HENT: Negative.    Respiratory: Negative.  Negative for shortness of breath.   Psychiatric/Behavioral:  Positive for sleep disturbance.    Physical Exam  BP 126/84 (BP Location: Left Arm, Patient Position: Sitting, Cuff Size: Large)   Pulse 88   Temp 98.4 F (36.9 C) (Oral)   Ht '6\' 1"'$  (1.854 m)   Wt 287 lb (130.2 kg)   SpO2 97%   BMI 37.87 kg/m  Physical Exam Constitutional:  General: He is not in acute distress.    Appearance: Normal appearance. He is obese. He is not ill-appearing.  HENT:     Head: Normocephalic and atraumatic.     Mouth/Throat:     Mouth: Mucous membranes are moist.     Pharynx: Oropharynx is clear.     Comments: Mallampati class III Cardiovascular:     Rate and Rhythm: Normal rate and regular rhythm.     Comments: RRR Pulmonary:     Effort: Pulmonary effort is normal.     Breath sounds: Normal breath sounds.     Comments: CTA Musculoskeletal:        General: Normal range of motion.     Cervical back: Neck supple.  Skin:    General: Skin is warm and dry.  Neurological:      General: No focal deficit present.     Mental Status: He is alert and oriented to person, place, and time. Mental status is at baseline.  Psychiatric:        Mood and Affect: Mood normal.        Behavior: Behavior normal.        Thought Content: Thought content normal.        Judgment: Judgment normal.      Lab Results:  CBC    Component Value Date/Time   WBC 13.0 (H) 09/18/2022 0540   RBC 4.88 09/18/2022 0540   HGB 14.6 09/18/2022 0540   HCT 43.4 09/18/2022 0540   PLT 178 09/18/2022 0540   MCV 88.9 09/18/2022 0540   MCH 29.9 09/18/2022 0540   MCHC 33.6 09/18/2022 0540   RDW 14.6 09/18/2022 0540   LYMPHSABS 0.9 09/18/2022 0540   MONOABS 1.3 (H) 09/18/2022 0540   EOSABS 0.0 09/18/2022 0540   BASOSABS 0.0 09/18/2022 0540    BMET    Component Value Date/Time   NA 135 09/18/2022 0540   K 3.3 (L) 09/18/2022 0540   CL 103 09/18/2022 0540   CO2 23 09/18/2022 0540   GLUCOSE 140 (H) 09/18/2022 0540   BUN 15 09/18/2022 0540   CREATININE 1.54 (H) 09/18/2022 0540   CALCIUM 8.5 (L) 09/18/2022 0540   GFRNONAA 54 (L) 09/18/2022 0540   GFRAA >60 05/25/2019 0250    BNP No results found for: "BNP"  ProBNP No results found for: "PROBNP"  Imaging: No results found.   Assessment & Plan:   Loud snoring - Patent has symptoms of loud snoring and witnessed apnea.  He gets on average 5 to 6 hours of sleep at night and feels well rested. No significant associated daytime sleepiness.  He does get tired with long drives. Epworth score 12.  BMI 37.  Concern patient has underlying obstructive sleep apnea, needs home sleep study to evaluate.  Reviewed risks of untreated sleep apnea including cardiac events, pulmonary hypertension, diabetes and stroke.  We also discussed treatment options including weight loss, oral appliance, CPAP therapy or referral to ENT for possible surgical options.  Encouraged weight loss efforts and side sleeping position.  Advised against driving if  experiencing excessive daytime sleepiness fatigue.  Patient to follow-up 1 to 2 weeks after sleep study to review results and treatment options if needed.    Martyn Ehrich, NP 11/03/2022

## 2024-02-09 LAB — AMB RESULTS CONSOLE CBG: Glucose: 121

## 2024-02-09 NOTE — Progress Notes (Unsigned)
 Pt was seen on 02/09/2024 at event screening. Blood glucose was 121 and BP was taken twice, 149/96 & 146/94. Pt noted he does have a PCP. Pt does not smoke. No SDOH needs were indicated.

## 2024-05-20 NOTE — Progress Notes (Signed)
 Pt attended 02/09/2024 screening event with BP of 146/94 and blood sugar was 121. Pt noted at event that he does have a PCP. At event pt did not indicate any SDOH needs. Pt also noted that he is not a smoker and listed Private as his insurance at the event.   Per initial f/u pt was mailed curtesy letter with BP management flyer & [employer] flyer.  Per chart review pt does have a PCP Astrid Bohr; [facility]), insurance, and is not a smoker. Pt's last appt with PCP was 03/23/2024 and has an upcoming appt on 06/23/2024. Pt does not indicate any SDOH needs at this time.  No additional pt f/u to be scheduled at this time per health equity protocol.
